# Patient Record
Sex: Male | Born: 1935 | Race: White | Hispanic: No | Marital: Married | Smoking: Former smoker
Health system: Southern US, Community
[De-identification: ages and names within clinical notes are randomized; demographics above are authoritative.]

## PROBLEM LIST (undated history)

## (undated) DIAGNOSIS — I4892 Unspecified atrial flutter: Secondary | ICD-10-CM

## (undated) DIAGNOSIS — I10 Essential (primary) hypertension: Secondary | ICD-10-CM

## (undated) DIAGNOSIS — I509 Heart failure, unspecified: Secondary | ICD-10-CM

## (undated) DIAGNOSIS — H900 Conductive hearing loss, bilateral: Secondary | ICD-10-CM

## (undated) DIAGNOSIS — F419 Anxiety disorder, unspecified: Secondary | ICD-10-CM

## (undated) DIAGNOSIS — R634 Abnormal weight loss: Secondary | ICD-10-CM

## (undated) DIAGNOSIS — R5383 Other fatigue: Secondary | ICD-10-CM

## (undated) DIAGNOSIS — M7500 Adhesive capsulitis of unspecified shoulder: Secondary | ICD-10-CM

## (undated) DIAGNOSIS — N401 Enlarged prostate with lower urinary tract symptoms: Secondary | ICD-10-CM

## (undated) DIAGNOSIS — K4030 Unilateral inguinal hernia, with obstruction, without gangrene, not specified as recurrent: Secondary | ICD-10-CM

## (undated) DIAGNOSIS — I420 Dilated cardiomyopathy: Secondary | ICD-10-CM

## (undated) HISTORY — DX: Dilated cardiomyopathy: I42.0

## (undated) HISTORY — DX: Essential (primary) hypertension: I10

## (undated) HISTORY — DX: Heart failure, unspecified: I50.9

## (undated) HISTORY — DX: Abnormal weight loss: R63.4

## (undated) HISTORY — DX: Unilateral inguinal hernia, with obstruction, without gangrene, not specified as recurrent: K40.30

## (undated) HISTORY — DX: Conductive hearing loss, bilateral: H90.0

## (undated) HISTORY — DX: Other fatigue: R53.83

## (undated) HISTORY — DX: Anxiety disorder, unspecified: F41.9

## (undated) HISTORY — DX: Adhesive capsulitis of unspecified shoulder: M75.00

## (undated) HISTORY — DX: Unspecified atrial flutter: I48.92

## (undated) HISTORY — DX: Benign prostatic hyperplasia with lower urinary tract symptoms: N40.1

---

## 2004-08-25 ENCOUNTER — Ambulatory Visit: Payer: Self-pay | Admitting: Internal Medicine

## 2005-01-20 ENCOUNTER — Ambulatory Visit: Payer: Self-pay | Admitting: Internal Medicine

## 2005-05-04 ENCOUNTER — Ambulatory Visit: Payer: Self-pay | Admitting: Internal Medicine

## 2005-09-15 ENCOUNTER — Ambulatory Visit: Payer: Self-pay | Admitting: Internal Medicine

## 2005-09-26 ENCOUNTER — Ambulatory Visit: Payer: Self-pay | Admitting: Internal Medicine

## 2005-12-15 ENCOUNTER — Ambulatory Visit: Payer: Self-pay | Admitting: Internal Medicine

## 2006-06-04 ENCOUNTER — Ambulatory Visit: Payer: Self-pay | Admitting: Internal Medicine

## 2006-06-04 LAB — CONVERTED CEMR LAB
AST: 23 units/L (ref 0–37)
Albumin: 4.3 g/dL (ref 3.5–5.2)
BUN: 19 mg/dL (ref 6–23)
Basophils Absolute: 0 10*3/uL (ref 0.0–0.1)
CO2: 28 meq/L (ref 19–32)
Calcium: 9.2 mg/dL (ref 8.4–10.5)
Chloride: 103 meq/L (ref 96–112)
Chol/HDL Ratio, serum: 3.8
Cholesterol: 181 mg/dL (ref 0–200)
Creatinine, Ser: 0.7 mg/dL (ref 0.4–1.5)
Eosinophil percent: 1.7 % (ref 0.0–5.0)
HCT: 47.8 % (ref 39.0–52.0)
HDL: 47.3 mg/dL (ref >39.0)
Hemoglobin: 16.1 g/dL (ref 13.0–17.0)
Lymphocytes Relative: 23.2 % (ref 12.0–46.0)
MCHC: 33.6 g/dL (ref 30.0–36.0)
Monocytes Relative: 8.6 % (ref 3.0–11.0)
Neutro Abs: 4.2 10*3/uL (ref 1.4–7.7)
Neutrophils Relative %: 66.3 % (ref 43.0–77.0)
PSA: 0.76 ng/mL (ref 0.10–4.00)
RDW: 13.8 % (ref 11.5–14.6)
TSH: 0.46 microintl units/mL (ref 0.35–5.50)
Total Bilirubin: 1.3 mg/dL — ABNORMAL HIGH (ref 0.3–1.2)
Triglyceride fasting, serum: 63 mg/dL (ref 0–149)

## 2006-08-28 ENCOUNTER — Ambulatory Visit: Payer: Self-pay | Admitting: Gastroenterology

## 2006-09-07 ENCOUNTER — Ambulatory Visit: Payer: Self-pay | Admitting: Gastroenterology

## 2006-09-07 HISTORY — PX: OTHER SURGICAL HISTORY: SHX169

## 2006-09-11 ENCOUNTER — Ambulatory Visit: Payer: Self-pay | Admitting: Internal Medicine

## 2007-01-17 ENCOUNTER — Ambulatory Visit: Payer: Self-pay | Admitting: Internal Medicine

## 2007-03-12 DIAGNOSIS — I10 Essential (primary) hypertension: Secondary | ICD-10-CM

## 2007-03-12 DIAGNOSIS — I110 Hypertensive heart disease with heart failure: Secondary | ICD-10-CM

## 2007-03-12 HISTORY — DX: Essential (primary) hypertension: I10

## 2007-05-03 ENCOUNTER — Ambulatory Visit: Payer: Self-pay | Admitting: Internal Medicine

## 2007-05-21 ENCOUNTER — Ambulatory Visit: Payer: Self-pay | Admitting: Internal Medicine

## 2007-07-15 ENCOUNTER — Telehealth: Payer: Self-pay | Admitting: Internal Medicine

## 2007-10-31 ENCOUNTER — Ambulatory Visit: Payer: Self-pay | Admitting: Internal Medicine

## 2007-10-31 DIAGNOSIS — K4030 Unilateral inguinal hernia, with obstruction, without gangrene, not specified as recurrent: Secondary | ICD-10-CM | POA: Insufficient documentation

## 2007-10-31 HISTORY — DX: Unilateral inguinal hernia, with obstruction, without gangrene, not specified as recurrent: K40.30

## 2007-12-26 ENCOUNTER — Ambulatory Visit: Payer: Self-pay | Admitting: Internal Medicine

## 2007-12-26 LAB — CONVERTED CEMR LAB
Albumin: 3.9 g/dL (ref 3.5–5.2)
Alkaline Phosphatase: 62 units/L (ref 39–117)
Basophils Absolute: 0 10*3/uL (ref 0.0–0.1)
Bilirubin, Direct: 0.1 mg/dL (ref 0.0–0.3)
Blood in Urine, dipstick: NEGATIVE
Eosinophils Absolute: 0.1 10*3/uL (ref 0.0–0.7)
GFR calc Af Amer: 107 mL/min
GFR calc non Af Amer: 88 mL/min
Glucose, Urine, Semiquant: NEGATIVE
HCT: 45.7 % (ref 39.0–52.0)
HDL: 40.2 mg/dL (ref >39.0)
Ketones, urine, test strip: NEGATIVE
MCV: 91.4 fL (ref 78.0–100.0)
Monocytes Absolute: 0.5 10*3/uL (ref 0.1–1.0)
Neutrophils Relative %: 64.2 % (ref 43.0–77.0)
Nitrite: NEGATIVE
Platelets: 227 10*3/uL (ref 150–400)
Potassium: 5 meq/L (ref 3.5–5.1)
Protein, U semiquant: NEGATIVE
RDW: 13.5 % (ref 11.5–14.6)
Sodium: 135 meq/L (ref 135–145)
Specific Gravity, Urine: 1.02
Total Bilirubin: 1 mg/dL (ref 0.3–1.2)
Triglycerides: 61 mg/dL (ref 0–149)
VLDL: 12 mg/dL (ref 0–40)
pH: 7

## 2008-01-02 ENCOUNTER — Ambulatory Visit: Payer: Self-pay | Admitting: Internal Medicine

## 2008-04-28 ENCOUNTER — Telehealth: Payer: Self-pay | Admitting: Internal Medicine

## 2008-05-19 ENCOUNTER — Ambulatory Visit: Payer: Self-pay | Admitting: Internal Medicine

## 2008-06-16 ENCOUNTER — Ambulatory Visit: Payer: Self-pay | Admitting: Internal Medicine

## 2008-06-16 DIAGNOSIS — H900 Conductive hearing loss, bilateral: Secondary | ICD-10-CM

## 2008-06-16 HISTORY — DX: Conductive hearing loss, bilateral: H90.0

## 2009-02-23 ENCOUNTER — Telehealth: Payer: Self-pay | Admitting: Internal Medicine

## 2009-04-21 ENCOUNTER — Ambulatory Visit: Payer: Self-pay | Admitting: Internal Medicine

## 2009-04-21 LAB — CONVERTED CEMR LAB
BUN: 18 mg/dL (ref 6–23)
Basophils Absolute: 0 10*3/uL (ref 0.0–0.1)
Bilirubin Urine: NEGATIVE
Bilirubin, Direct: 0 mg/dL (ref 0.0–0.3)
Blood in Urine, dipstick: NEGATIVE
CO2: 30 meq/L (ref 19–32)
Chloride: 105 meq/L (ref 96–112)
Cholesterol: 165 mg/dL (ref 0–200)
Creatinine, Ser: 0.7 mg/dL (ref 0.4–1.5)
Eosinophils Absolute: 0.1 10*3/uL (ref 0.0–0.7)
Ketones, urine, test strip: NEGATIVE
LDL Cholesterol: 107 mg/dL — ABNORMAL HIGH (ref 0–99)
MCHC: 34.4 g/dL (ref 30.0–36.0)
MCV: 88.7 fL (ref 78.0–100.0)
Monocytes Absolute: 0.5 10*3/uL (ref 0.1–1.0)
Neutrophils Relative %: 64.8 % (ref 43.0–77.0)
Platelets: 218 10*3/uL (ref 150.0–400.0)
Protein, U semiquant: NEGATIVE
Total Bilirubin: 1.3 mg/dL — ABNORMAL HIGH (ref 0.3–1.2)
Triglycerides: 59 mg/dL (ref 0.0–149.0)
Urobilinogen, UA: 0.2
WBC: 6.4 10*3/uL (ref 4.5–10.5)

## 2009-04-28 ENCOUNTER — Ambulatory Visit: Payer: Self-pay | Admitting: Internal Medicine

## 2009-04-28 DIAGNOSIS — N138 Other obstructive and reflux uropathy: Secondary | ICD-10-CM

## 2009-04-28 DIAGNOSIS — N401 Enlarged prostate with lower urinary tract symptoms: Secondary | ICD-10-CM

## 2009-04-28 HISTORY — DX: Benign prostatic hyperplasia with lower urinary tract symptoms: N13.8

## 2009-04-28 HISTORY — DX: Benign prostatic hyperplasia with lower urinary tract symptoms: N40.1

## 2009-12-20 ENCOUNTER — Ambulatory Visit: Payer: Self-pay | Admitting: Internal Medicine

## 2009-12-20 DIAGNOSIS — M7500 Adhesive capsulitis of unspecified shoulder: Secondary | ICD-10-CM | POA: Insufficient documentation

## 2009-12-20 HISTORY — DX: Adhesive capsulitis of unspecified shoulder: M75.00

## 2010-05-05 ENCOUNTER — Ambulatory Visit: Payer: Self-pay | Admitting: Internal Medicine

## 2010-09-04 LAB — CONVERTED CEMR LAB: PSA: 0.78 ng/mL (ref 0.10–4.00)

## 2010-09-06 NOTE — Assessment & Plan Note (Signed)
Summary: FLU SHOT/CB  Nurse Visit   Review of Systems       Flu Vaccine Consent Questions     Do you have a history of severe allergic reactions to this vaccine? no    Any prior history of allergic reactions to egg and/or gelatin? no    Do you have a sensitivity to the preservative Thimersol? no    Do you have a past history of Guillan-Barre Syndrome? no    Do you currently have an acute febrile illness? no    Have you ever had a severe reaction to latex? no    Vaccine information given and explained to patient? yes    Are you currently pregnant? no    Lot Number:[id number]   Exp Date:02/04/2011   Site Given  Left Deltoid IM    Allergies: No Known Drug Allergies  Orders Added: 1)  Flu Vaccine 76yrs + MEDICARE PATIENTS [Q2039] 2)  Administration Flu vaccine - MCR [G0008]
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
## 2010-12-19 ENCOUNTER — Telehealth: Payer: Self-pay | Admitting: *Deleted

## 2010-12-19 NOTE — Telephone Encounter (Signed)
Wife called requesting appt with Dr. Lovell Sheehan, but no openings per Dr. Lovell Sheehan.  Offered a different MD, and she will talk to him and call back. He had a virus, and now is having night sweats.

## 2010-12-20 NOTE — Telephone Encounter (Signed)
Did not call back.

## 2010-12-29 ENCOUNTER — Encounter: Payer: Self-pay | Admitting: Internal Medicine

## 2011-01-13 ENCOUNTER — Ambulatory Visit (INDEPENDENT_AMBULATORY_CARE_PROVIDER_SITE_OTHER): Payer: Medicare Other | Admitting: Internal Medicine

## 2011-01-13 ENCOUNTER — Other Ambulatory Visit (INDEPENDENT_AMBULATORY_CARE_PROVIDER_SITE_OTHER): Payer: Medicare Other

## 2011-01-13 ENCOUNTER — Other Ambulatory Visit: Payer: Self-pay | Admitting: Internal Medicine

## 2011-01-13 ENCOUNTER — Ambulatory Visit (INDEPENDENT_AMBULATORY_CARE_PROVIDER_SITE_OTHER)
Admission: RE | Admit: 2011-01-13 | Discharge: 2011-01-13 | Disposition: A | Discharge status: Home / Self Care | Payer: Medicare Other | Source: Ambulatory Visit | Attending: Internal Medicine | Admitting: Internal Medicine

## 2011-01-13 VITALS — BP 136/84 | HR 72 | Temp 98.2°F | Resp 14 | Ht 68.0 in | Wt 145.0 lb

## 2011-01-13 DIAGNOSIS — I10 Essential (primary) hypertension: Secondary | ICD-10-CM

## 2011-01-13 DIAGNOSIS — R634 Abnormal weight loss: Secondary | ICD-10-CM

## 2011-01-13 DIAGNOSIS — F329 Major depressive disorder, single episode, unspecified: Secondary | ICD-10-CM

## 2011-01-13 LAB — CBC WITH DIFFERENTIAL/PLATELET
Basophils Absolute: 0 10*3/uL (ref 0.0–0.1)
Basophils Relative: 0.2 % (ref 0.0–3.0)
Eosinophils Absolute: 0.2 10*3/uL (ref 0.0–0.7)
Eosinophils Relative: 2.7 % (ref 0.0–5.0)
HCT: 44 % (ref 39.0–52.0)
Hemoglobin: 15.1 g/dL (ref 13.0–17.0)
Lymphs Abs: 2.9 10*3/uL (ref 0.7–4.0)
Monocytes Relative: 6 % (ref 3.0–12.0)
Neutro Abs: 5.5 10*3/uL (ref 1.4–7.7)
Platelets: 224 10*3/uL (ref 150.0–400.0)
RBC: 4.85 Mil/uL (ref 4.22–5.81)
WBC: 9.3 10*3/uL (ref 4.5–10.5)

## 2011-01-13 LAB — TSH: TSH: 0.36 u[IU]/mL (ref 0.35–5.50)

## 2011-01-13 LAB — HEPATIC FUNCTION PANEL: Total Bilirubin: 0.8 mg/dL (ref 0.3–1.2)

## 2011-01-13 MED ORDER — MIRTAZAPINE 15 MG PO TABS: 15.0000 mg | ORAL_TABLET | Freq: Every day | ORAL | Status: DC

## 2011-01-13 NOTE — Progress Notes (Signed)
  Subjective:    Patient ID: Bryan Paul, male    DOB: 09-26-1934, 76 y.o.   MRN: [id number]  HPI And has lost 10 pounds since he was last seen he was last seen one year ago.  Family is concerned about his weight loss he has not had any night sweats no diarrhea no cough no blood in stool or dark stools he has been feeling mild to moderate depression he does sleep well. His risk factors do not include smoking his mother had uterine cancer. He does not have any cramps no reported pain    Review of Systems  Constitutional: Negative for fever and fatigue.  HENT: Negative for hearing loss, congestion, neck pain and postnasal drip.   Eyes: Negative for discharge, redness and visual disturbance.  Respiratory: Negative for cough, shortness of breath and wheezing.   Cardiovascular: Negative for leg swelling.  Gastrointestinal: Negative for abdominal pain, constipation and abdominal distention.  Genitourinary: Negative for urgency and frequency.  Musculoskeletal: Negative for joint swelling and arthralgias.  Skin: Negative for color change and rash.  Neurological: Negative for weakness and light-headedness.  Hematological: Negative for adenopathy.  Psychiatric/Behavioral: Negative for behavioral problems.   Past Medical History  Diagnosis Date  . Hypertension    Past Surgical History  Procedure Date  . Colonscopy 09/07/2006    reports that he has quit smoking. He does not have any smokeless tobacco history on file. He reports that he does not drink alcohol or use illicit drugs. family history includes Aneurysm in an unspecified family member. Allergies not on file     Objective:   Physical Exam  Constitutional: He appears well-developed and well-nourished.  HENT:  Head: Normocephalic and atraumatic.  Eyes: Conjunctivae are normal. Pupils are equal, round, and reactive to light.  Neck: Normal range of motion. Neck supple.  Cardiovascular: Normal rate and regular rhythm.     Pulmonary/Chest: Effort normal and breath sounds normal.  Abdominal: Soft. Bowel sounds are normal.          Assessment & Plan:  I do not see any ominous signs with a weight loss however I should monitor a thyroid a CBC with differential and a liver function today.  We will also obtain a screening chest x-ray. We will begin Remeron 15 mg by mouth each bedtime

## 2011-03-02 ENCOUNTER — Encounter: Payer: Self-pay | Admitting: Internal Medicine

## 2011-05-15 ENCOUNTER — Ambulatory Visit (INDEPENDENT_AMBULATORY_CARE_PROVIDER_SITE_OTHER): Payer: Medicare Other | Admitting: Internal Medicine

## 2011-05-15 ENCOUNTER — Ambulatory Visit: Payer: No Typology Code available for payment source | Admitting: Internal Medicine

## 2011-05-15 DIAGNOSIS — Z23 Encounter for immunization: Secondary | ICD-10-CM

## 2011-06-06 ENCOUNTER — Telehealth: Payer: Self-pay | Admitting: Internal Medicine

## 2011-06-06 NOTE — Telephone Encounter (Signed)
Pt requesting a prescription for his nerves pt requesting ativan  [facility] [phone]

## 2011-06-06 NOTE — Telephone Encounter (Signed)
Per dr Lovell Sheehan- m ay have an ov with anyone to discuss

## 2011-06-13 ENCOUNTER — Ambulatory Visit (INDEPENDENT_AMBULATORY_CARE_PROVIDER_SITE_OTHER): Payer: Medicare Other | Admitting: Internal Medicine

## 2011-06-13 ENCOUNTER — Encounter: Payer: Self-pay | Admitting: Internal Medicine

## 2011-06-13 ENCOUNTER — Ambulatory Visit: Payer: Medicare Other | Admitting: Family Medicine

## 2011-06-13 ENCOUNTER — Telehealth: Payer: Self-pay | Admitting: Internal Medicine

## 2011-06-13 DIAGNOSIS — H900 Conductive hearing loss, bilateral: Secondary | ICD-10-CM

## 2011-06-13 DIAGNOSIS — R634 Abnormal weight loss: Secondary | ICD-10-CM

## 2011-06-13 DIAGNOSIS — F419 Anxiety disorder, unspecified: Secondary | ICD-10-CM

## 2011-06-13 DIAGNOSIS — F329 Major depressive disorder, single episode, unspecified: Secondary | ICD-10-CM

## 2011-06-13 DIAGNOSIS — I10 Essential (primary) hypertension: Secondary | ICD-10-CM

## 2011-06-13 DIAGNOSIS — N401 Enlarged prostate with lower urinary tract symptoms: Secondary | ICD-10-CM

## 2011-06-13 HISTORY — DX: Abnormal weight loss: R63.4

## 2011-06-13 HISTORY — DX: Anxiety disorder, unspecified: F41.9

## 2011-06-13 LAB — CBC WITH DIFFERENTIAL/PLATELET
Basophils Relative: 0.4 % (ref 0.0–3.0)
Eosinophils Absolute: 0.2 10*3/uL (ref 0.0–0.7)
Eosinophils Relative: 2 % (ref 0.0–5.0)
Lymphocytes Relative: 25.6 % (ref 12.0–46.0)
Neutrophils Relative %: 63.6 % (ref 43.0–77.0)
RBC: 5.17 Mil/uL (ref 4.22–5.81)
WBC: 7.8 10*3/uL (ref 4.5–10.5)

## 2011-06-13 LAB — TSH: TSH: 0.57 u[IU]/mL (ref 0.35–5.50)

## 2011-06-13 MED ORDER — MIRTAZAPINE 15 MG PO TABS: 15.0000 mg | ORAL_TABLET | Freq: Every day | ORAL | Status: DC

## 2011-06-13 MED ORDER — SAW PALMETTO (SERENOA REPENS) 160 MG PO CAPS: 160.0000 mg | ORAL_CAPSULE | Freq: Two times a day (BID) | ORAL | Status: AC

## 2011-06-13 MED ORDER — LORAZEPAM 0.5 MG PO TABS: 0.5000 mg | ORAL_TABLET | Freq: Two times a day (BID) | ORAL | Status: DC

## 2011-06-13 MED ORDER — SOLIFENACIN SUCCINATE 5 MG PO TABS: 5.0000 mg | ORAL_TABLET | Freq: Every day | ORAL | Status: DC

## 2011-06-13 NOTE — Telephone Encounter (Signed)
Left message on machine Needs to pick up at license Tag department e

## 2011-06-13 NOTE — Progress Notes (Signed)
  Subjective:    Patient ID: Bryan Paul, male    DOB: 12/10/1934, 76 y.o.   MRN: [id number]  HPI This 76 year old with a history of hypertension and hearing loss who presents with 2 complaints number one his wife perceives that he has been losing weight we reviewed his weight over the past 2 years and he has indeed lost 10 pounds but his weight appears to fluctuate and currently he is up 3 pounds from the lowest point.  He states that he well he does not have diarrhea he denied any dizziness or syncope.  He does have anxiety and has not been sleeping well but care for a son with HIV. He has not been taking his antidepressant and he has been given a trial of saw palmetto for benign prostatic hypertrophy that has not been effective.  His wife states that he urinates "every hour on the hour"   Review of Systems  Constitutional: Negative for fever and fatigue.  HENT: Negative for hearing loss, congestion, neck pain and postnasal drip.   Eyes: Negative for discharge, redness and visual disturbance.  Respiratory: Negative for cough, shortness of breath and wheezing.   Cardiovascular: Negative for leg swelling.  Gastrointestinal: Negative for abdominal pain, constipation and abdominal distention.  Genitourinary: Negative for urgency and frequency.  Musculoskeletal: Negative for joint swelling and arthralgias.  Skin: Negative for color change and rash.  Neurological: Negative for weakness and light-headedness.  Hematological: Negative for adenopathy.  Psychiatric/Behavioral: Negative for behavioral problems.   Past Medical History  Diagnosis Date  . Hypertension    Past Surgical History  Procedure Date  . Colonscopy 09/07/2006    reports that he has quit smoking. He does not have any smokeless tobacco history on file. He reports that he does not drink alcohol or use illicit drugs. family history includes Aneurysm in an unspecified family member. No Known Allergies     Objective:    Physical Exam  Nursing note and vitals reviewed. Constitutional: He appears well-developed and well-nourished.  HENT:  Head: Normocephalic and atraumatic.  Eyes: Conjunctivae are normal. Pupils are equal, round, and reactive to light.  Neck: Normal range of motion. Neck supple.  Cardiovascular: Normal rate and regular rhythm.   Pulmonary/Chest: Effort normal and breath sounds normal.  Abdominal: Soft. Bowel sounds are normal.          Assessment & Plan:  For the benign prostatic hypertrophy we will do a trial of VESIcare 5 mg by mouth with supper and monitor his progress we'll discontinue the saw palmetto since it has been unaffected.  His blood pressure stable on his current medications we'll make no changes  We discussed the weight loss and I do not see a pathologic cause for the weight loss however I will monitor TSH and CBC as well as a PSA. We will give him Ativan at bedtime for anxiety and sleep.

## 2011-06-13 NOTE — Patient Instructions (Signed)
The patient is instructed to continue all medications as prescribed. Schedule followup with check out clerk upon leaving the clinic  

## 2011-06-13 NOTE — Telephone Encounter (Signed)
Pts wife called and is req to get 2 handicap placards for their two vehicles. Pls call when ready for pick up.

## 2011-07-11 ENCOUNTER — Telehealth: Payer: Self-pay

## 2011-07-11 NOTE — Telephone Encounter (Signed)
Pt's son called and pt was put on anti-depressent and it has made it him really sick.  Pt has stopped the medication.  Pt needs his lorazepam called in to [facility].  Pt's son Allyn Bertoni believes pt lost the rx for his medication. Pls advise.

## 2011-07-11 NOTE — Telephone Encounter (Signed)
Talked with pharmacy and they state they have not filled it there in about a year- his wife called to see when it was refilled and steve called to see when his was last filled- please advise

## 2011-07-12 ENCOUNTER — Other Ambulatory Visit: Payer: Self-pay | Admitting: *Deleted

## 2011-07-12 NOTE — Telephone Encounter (Signed)
Called in- ok per dr Lovell Sheehan after talking directly with pt

## 2011-08-16 ENCOUNTER — Ambulatory Visit: Payer: Medicare Other | Admitting: Internal Medicine

## 2011-09-28 ENCOUNTER — Telehealth: Payer: Self-pay | Admitting: Internal Medicine

## 2011-09-28 NOTE — Telephone Encounter (Signed)
Pt need samples of vesicare 5mg . Pt has 4 pills left

## 2011-09-29 NOTE — Telephone Encounter (Signed)
pt'ws wife informed- they are out front

## 2011-12-13 ENCOUNTER — Telehealth: Payer: Self-pay | Admitting: Internal Medicine

## 2011-12-13 NOTE — Telephone Encounter (Signed)
Left message on machine Samples up fron t for pt

## 2011-12-13 NOTE — Telephone Encounter (Signed)
Pt requesting samples of vesicare. Pt would like to pick up today. Please contact

## 2011-12-27 ENCOUNTER — Other Ambulatory Visit: Payer: Self-pay | Admitting: *Deleted

## 2011-12-27 DIAGNOSIS — F419 Anxiety disorder, unspecified: Secondary | ICD-10-CM

## 2011-12-27 MED ORDER — LORAZEPAM 0.5 MG PO TABS: 0.5000 mg | ORAL_TABLET | Freq: Two times a day (BID) | ORAL | Status: DC

## 2012-01-19 ENCOUNTER — Ambulatory Visit: Payer: Medicare Other | Admitting: Internal Medicine

## 2012-02-05 ENCOUNTER — Other Ambulatory Visit: Payer: Self-pay | Admitting: *Deleted

## 2012-02-05 DIAGNOSIS — F419 Anxiety disorder, unspecified: Secondary | ICD-10-CM

## 2012-02-05 MED ORDER — LORAZEPAM 0.5 MG PO TABS: 0.5000 mg | ORAL_TABLET | Freq: Two times a day (BID) | ORAL | Status: AC

## 2012-04-23 ENCOUNTER — Telehealth: Payer: Self-pay | Admitting: Internal Medicine

## 2012-04-23 NOTE — Telephone Encounter (Signed)
Pt needs samples of micardis 80 mg. If no samples of avail call rx into [facility] [phone]

## 2012-04-23 NOTE — Telephone Encounter (Signed)
4 boxes out front for pt-family informed

## 2012-05-06 ENCOUNTER — Encounter: Payer: Self-pay | Admitting: Internal Medicine

## 2012-05-06 ENCOUNTER — Ambulatory Visit (INDEPENDENT_AMBULATORY_CARE_PROVIDER_SITE_OTHER): Payer: Medicare Other | Admitting: Internal Medicine

## 2012-05-06 VITALS — BP 160/90 | HR 76 | Temp 98.2°F | Resp 16 | Ht 68.0 in | Wt 152.0 lb

## 2012-05-06 DIAGNOSIS — N401 Enlarged prostate with lower urinary tract symptoms: Secondary | ICD-10-CM

## 2012-05-06 DIAGNOSIS — Z23 Encounter for immunization: Secondary | ICD-10-CM

## 2012-05-06 DIAGNOSIS — N411 Chronic prostatitis: Secondary | ICD-10-CM

## 2012-05-06 DIAGNOSIS — I10 Essential (primary) hypertension: Secondary | ICD-10-CM

## 2012-05-06 DIAGNOSIS — R350 Frequency of micturition: Secondary | ICD-10-CM

## 2012-05-06 LAB — POCT URINALYSIS DIPSTICK
Leukocytes, UA: NEGATIVE
Nitrite, UA: NEGATIVE
Protein, UA: NEGATIVE
Spec Grav, UA: 1.025
Urobilinogen, UA: 0.2

## 2012-05-06 MED ORDER — SOLIFENACIN SUCCINATE 10 MG PO TABS: 10.0000 mg | ORAL_TABLET | Freq: Every day | ORAL | Status: DC

## 2012-05-06 MED ORDER — CIPROFLOXACIN HCL 500 MG PO TABS: 500.0000 mg | ORAL_TABLET | Freq: Two times a day (BID) | ORAL | Status: DC

## 2012-05-06 NOTE — Progress Notes (Signed)
  Subjective:    Patient ID: Bryan Paul, male    DOB: 01/26/1935, 77 y.o.   MRN: [id number]  HPI The patient has been incontinent at  Night. Hard to start flow. Wakes up 2-3 times at night to uninate Notes leaking on the way to the rest room Added vesicar and it helped  Out of micardis for several days    Review of Systems  Genitourinary: Positive for urgency, frequency and decreased urine volume.  Musculoskeletal: Positive for back pain.       Objective:   Physical Exam  Nursing note and vitals reviewed. Cardiovascular: Normal rate and regular rhythm.   Murmur heard. Pulmonary/Chest: Effort normal and breath sounds normal.  Abdominal: Soft. Bowel sounds are normal.  Genitourinary:       Prostate +2 enlarged tender to touch can appreciate normal architecture but is markedly enlarged to approximately 60 g          Assessment & Plan:  Acute prostatitis on top of chronic BPH.  Will treat with Cipro 500 twice a day for 15 days.  Agree with continuing saw palmetto and VESIcare 10 mg by mouth daily stable blood pressure 1 Micardis note elevation today because patient was out of medication

## 2012-05-06 NOTE — Patient Instructions (Signed)
The patient is instructed to continue all medications as prescribed. Schedule followup with check out clerk upon leaving the clinic  

## 2012-05-07 LAB — CBC WITH DIFFERENTIAL/PLATELET
Eosinophils Relative: 2 % (ref 0.0–5.0)
Lymphocytes Relative: 24.9 % (ref 12.0–46.0)
MCV: 92.1 fl (ref 78.0–100.0)
Monocytes Absolute: 0.6 10*3/uL (ref 0.1–1.0)
Neutrophils Relative %: 65.4 % (ref 43.0–77.0)
Platelets: 228 10*3/uL (ref 150.0–400.0)
WBC: 8.1 10*3/uL (ref 4.5–10.5)

## 2012-05-07 LAB — BASIC METABOLIC PANEL
Chloride: 102 mEq/L (ref 96–112)
GFR: 90.82 mL/min (ref >60.00)
Glucose, Bld: 93 mg/dL (ref 70–99)
Potassium: 4.6 mEq/L (ref 3.5–5.1)
Sodium: 134 mEq/L — ABNORMAL LOW (ref 135–145)

## 2012-05-07 LAB — PSA: PSA: 1.02 ng/mL (ref 0.10–4.00)

## 2012-06-03 ENCOUNTER — Telehealth: Payer: Self-pay | Admitting: Internal Medicine

## 2012-06-13 NOTE — Telephone Encounter (Signed)
Opened in error

## 2012-07-02 ENCOUNTER — Ambulatory Visit: Payer: Medicare Other | Admitting: Internal Medicine

## 2012-08-13 ENCOUNTER — Ambulatory Visit: Payer: Medicare Other | Admitting: Internal Medicine

## 2012-08-23 ENCOUNTER — Ambulatory Visit (INDEPENDENT_AMBULATORY_CARE_PROVIDER_SITE_OTHER): Payer: Medicare Other | Admitting: Internal Medicine

## 2012-08-23 ENCOUNTER — Encounter: Payer: Self-pay | Admitting: Internal Medicine

## 2012-08-23 VITALS — BP 140/80 | HR 76 | Temp 98.2°F | Resp 16 | Ht 68.0 in | Wt 154.0 lb

## 2012-08-23 DIAGNOSIS — I10 Essential (primary) hypertension: Secondary | ICD-10-CM

## 2012-08-23 DIAGNOSIS — N400 Enlarged prostate without lower urinary tract symptoms: Secondary | ICD-10-CM

## 2012-08-23 MED ORDER — DOXAZOSIN MESYLATE 4 MG PO TABS: 4.0000 mg | ORAL_TABLET | Freq: Every day | ORAL | Status: DC

## 2012-08-23 NOTE — Patient Instructions (Addendum)
The cardura will work both for the urine and the blood pressure

## 2012-08-23 NOTE — Progress Notes (Signed)
  Subjective:    Patient ID: Bryan Paul, male    DOB: Jul 02, 1935, 78 y.o.   MRN: [id number]  HPI Patient is a 78 year old male who is followed for hypertension and BPH was symptomology.  This had some urine incontinence and VESIcare did help.the cost of VESIcare in the cost of his blood pressure medications has created an issue of noncompliance   Review of Systems  Constitutional: Negative for fever and fatigue.  HENT: Positive for hearing loss. Negative for congestion, neck pain and postnasal drip.   Eyes: Negative for discharge, redness and visual disturbance.  Respiratory: Negative for cough, shortness of breath and wheezing.   Cardiovascular: Negative for leg swelling.  Gastrointestinal: Negative for abdominal pain, constipation and abdominal distention.  Genitourinary: Positive for urgency, frequency, enuresis and difficulty urinating. Negative for dysuria.  Musculoskeletal: Negative for joint swelling and arthralgias.  Skin: Negative for color change and rash.  Neurological: Negative for weakness and light-headedness.  Hematological: Negative for adenopathy.  Psychiatric/Behavioral: Negative for behavioral problems.       Objective:   Physical Exam  Constitutional: He appears well-developed and well-nourished.  HENT:  Head: Normocephalic and atraumatic.  Eyes: Conjunctivae normal are normal. Pupils are equal, round, and reactive to light.  Neck: Normal range of motion. Neck supple.  Cardiovascular: Normal rate and regular rhythm.   Pulmonary/Chest: Effort normal and breath sounds normal.  Abdominal: Soft. Bowel sounds are normal.          Assessment & Plan:  Cardura 4 mg one by mouth daily at bedtime for blood pressure and prostate symptoms

## 2012-11-25 ENCOUNTER — Ambulatory Visit: Payer: Medicare Other | Admitting: Internal Medicine

## 2013-03-19 ENCOUNTER — Ambulatory Visit: Payer: Medicare Other | Admitting: Internal Medicine

## 2013-04-03 ENCOUNTER — Telehealth: Payer: Self-pay | Admitting: Internal Medicine

## 2013-04-03 MED ORDER — LORAZEPAM 0.5 MG PO TABS: 0.5000 mg | ORAL_TABLET | Freq: Two times a day (BID) | ORAL | Status: DC | PRN

## 2013-04-03 NOTE — Telephone Encounter (Signed)
PT wife is calling to request refill of LORazepam (ATIVAN) 0.5 MG tablet. She would like this sent to [facility] in [place]. Please assist.

## 2013-04-03 NOTE — Telephone Encounter (Signed)
done

## 2013-04-14 ENCOUNTER — Ambulatory Visit (INDEPENDENT_AMBULATORY_CARE_PROVIDER_SITE_OTHER): Payer: Medicare Other | Admitting: Internal Medicine

## 2013-04-14 ENCOUNTER — Encounter: Payer: Self-pay | Admitting: Internal Medicine

## 2013-04-14 VITALS — BP 140/84 | HR 72 | Temp 98.2°F | Resp 16 | Ht 68.0 in | Wt 149.0 lb

## 2013-04-14 DIAGNOSIS — Z23 Encounter for immunization: Secondary | ICD-10-CM

## 2013-04-14 DIAGNOSIS — N138 Other obstructive and reflux uropathy: Secondary | ICD-10-CM

## 2013-04-14 DIAGNOSIS — N139 Obstructive and reflux uropathy, unspecified: Secondary | ICD-10-CM

## 2013-04-14 DIAGNOSIS — R634 Abnormal weight loss: Secondary | ICD-10-CM

## 2013-04-14 DIAGNOSIS — E039 Hypothyroidism, unspecified: Secondary | ICD-10-CM

## 2013-04-14 DIAGNOSIS — N401 Enlarged prostate with lower urinary tract symptoms: Secondary | ICD-10-CM

## 2013-04-14 NOTE — Progress Notes (Signed)
  Subjective:    Patient ID: Bryan Paul, male    DOB: April 18, 1935, 78 y.o.   MRN: [id number]  HPI Has not gained weight  BMI  Is 22 No diarrhea nausea or chest pain Blood pressure good     Review of Systems  Constitutional: Negative for fever and fatigue.  HENT: Negative for hearing loss, congestion, neck pain and postnasal drip.   Eyes: Negative for discharge, redness and visual disturbance.  Respiratory: Negative for cough, shortness of breath and wheezing.   Cardiovascular: Negative for leg swelling.  Gastrointestinal: Negative for abdominal pain, constipation and abdominal distention.  Genitourinary: Negative for urgency and frequency.  Musculoskeletal: Negative for joint swelling and arthralgias.  Skin: Negative for color change and rash.  Neurological: Negative for weakness and light-headedness.  Hematological: Negative for adenopathy.  Psychiatric/Behavioral: Negative for behavioral problems.   Past Medical History  Diagnosis Date  . Hypertension     History   Social History  . Marital Status: Married    Spouse Name: N/A    Number of Children: N/A  . Years of Education: N/A   Occupational History  . Not on file.   Social History Main Topics  . Smoking status: Former Games developer  . Smokeless tobacco: Not on file  . Alcohol Use: No  . Drug Use: No  . Sexual Activity: Not on file   Other Topics Concern  . Not on file   Social History Narrative  . No narrative on file    Past Surgical History  Procedure Laterality Date  . Colonscopy  09/07/2006    Family History  Problem Relation Age of Onset  . Aneurysm      No Known Allergies  Current Outpatient Prescriptions on File Prior to Visit  Medication Sig Dispense Refill  . aspirin 81 MG tablet Take 81 mg by mouth daily.        Marland Kitchen doxazosin (CARDURA) 4 MG tablet Take 1 tablet (4 mg total) by mouth at bedtime.  30 tablet  11  . LORazepam (ATIVAN) 0.5 MG tablet Take 1 tablet (0.5 mg total) by mouth 2  (two) times daily as needed for anxiety.  30 tablet  1   No current facility-administered medications on file prior to visit.    BP 140/84  Pulse 72  Temp(Src) 98.2 F (36.8 C)  Resp 16  Ht 5\' 8"  (1.727 m)  Wt 149 lb (67.586 kg)  BMI 22.66 kg/m2       Objective:   Physical Exam  Constitutional: He appears well-developed and well-nourished.  HENT:  Head: Normocephalic and atraumatic.  Eyes: Conjunctivae are normal. Pupils are equal, round, and reactive to light.  Neck: Normal range of motion. Neck supple.  Cardiovascular: Normal rate, regular rhythm and intact distal pulses.  PMI is not displaced.   Murmur heard.  Systolic murmur is present with a grade of 1/6  Pulmonary/Chest: Effort normal and breath sounds normal.  Abdominal: Soft. Bowel sounds are normal.          Assessment & Plan:  weigth loss monitor weight loss and fatigue with appropriate labs  Stable HTN BPH with outlet obstruction on carduar improved

## 2013-04-14 NOTE — Patient Instructions (Signed)
The patient is instructed to continue all medications as prescribed. Schedule followup with check out clerk upon leaving the clinic  

## 2013-04-15 LAB — COMPREHENSIVE METABOLIC PANEL
ALT: 17 U/L (ref 0–53)
CO2: 29 mEq/L (ref 19–32)
Calcium: 9.3 mg/dL (ref 8.4–10.5)
Chloride: 104 mEq/L (ref 96–112)
Creatinine, Ser: 0.8 mg/dL (ref 0.4–1.5)
GFR: 102.76 mL/min (ref >60.00)
Total Protein: 6.2 g/dL (ref 6.0–8.3)

## 2013-04-15 LAB — TSH: TSH: 0.41 u[IU]/mL (ref 0.35–5.50)

## 2013-05-13 ENCOUNTER — Telehealth: Payer: Self-pay | Admitting: Internal Medicine

## 2013-05-13 MED ORDER — LORAZEPAM 0.5 MG PO TABS
0.5000 mg | ORAL_TABLET | Freq: Two times a day (BID) | ORAL | Status: DC | PRN
Start: 2013-05-13 — End: 2013-08-13

## 2013-05-13 NOTE — Telephone Encounter (Signed)
Pt wife is calling to discuss labs from 04/14/13. She is also requesting a refill of his LORazepam (ATIVAN) 0.5 MG tablet, be sent to [facility] in [place]. Please assist.

## 2013-08-13 ENCOUNTER — Other Ambulatory Visit: Payer: Self-pay | Admitting: *Deleted

## 2013-08-13 ENCOUNTER — Telehealth: Payer: Self-pay | Admitting: Internal Medicine

## 2013-08-13 MED ORDER — LORAZEPAM 0.5 MG PO TABS: 0.5000 mg | ORAL_TABLET | Freq: Two times a day (BID) | ORAL | Status: DC | PRN

## 2013-08-13 NOTE — Telephone Encounter (Signed)
[facility] [place] requesting refill of LORazepam (ATIVAN) 0.5 MG tablet #30, 3 refills, last filled 07/19/13

## 2013-09-25 ENCOUNTER — Other Ambulatory Visit: Payer: Self-pay | Admitting: Internal Medicine

## 2013-09-29 ENCOUNTER — Ambulatory Visit (INDEPENDENT_AMBULATORY_CARE_PROVIDER_SITE_OTHER): Payer: Medicare Other | Admitting: Family

## 2013-09-29 ENCOUNTER — Encounter: Payer: Self-pay | Admitting: Family

## 2013-09-29 ENCOUNTER — Ambulatory Visit: Payer: Medicare Other | Admitting: Family

## 2013-09-29 VITALS — BP 148/88 | HR 74 | Ht 68.0 in | Wt 152.0 lb

## 2013-09-29 DIAGNOSIS — H902 Conductive hearing loss, unspecified: Secondary | ICD-10-CM

## 2013-09-29 DIAGNOSIS — I10 Essential (primary) hypertension: Secondary | ICD-10-CM

## 2013-09-29 DIAGNOSIS — I160 Hypertensive urgency: Secondary | ICD-10-CM

## 2013-09-29 MED ORDER — LISINOPRIL-HYDROCHLOROTHIAZIDE 10-12.5 MG PO TABS: 1.0000 | ORAL_TABLET | Freq: Every day | ORAL | Status: DC

## 2013-09-29 NOTE — Progress Notes (Signed)
Pre visit review using our clinic review tool, if applicable. No additional management support is needed unless otherwise documented below in the visit note. 

## 2013-09-29 NOTE — Progress Notes (Signed)
   Subjective:    Patient ID: Bryan Paul, male    DOB: 11-28-1934, 79 y.o.   MRN: [id number]  HPI  79 year old white male, nonsmoker, patient of Bryan Paul is in today as a hospital followup from 09/25/2013. Patient presented to [facility] with hypertensive urgency, blood pressure 220/107 initially taken at the pharmacy didn't confirm that the emergency department. Has a history of hypertension has been controlled by diet. Patient had a CT scan of the head due to memory disturbance that has completely resolved. Denies any chest pain, palpitations, shortness of breath or edema. He is tolerating lisinopril HCT 10/12.5 well.   Review of Systems  Constitutional: Negative.   Respiratory: Negative.   Cardiovascular: Negative.   Gastrointestinal: Negative.   Endocrine: Negative.   Musculoskeletal: Negative.   Skin: Positive for color change.  Neurological: Negative.   Hematological: Negative.   Psychiatric/Behavioral: Negative.    Past Medical History  Diagnosis Date  . Hypertension     History   Social History  . Marital Status: Married    Spouse Name: N/A    Number of Children: N/A  . Years of Education: N/A   Occupational History  . Not on file.   Social History Main Topics  . Smoking status: Former Games developer  . Smokeless tobacco: Not on file  . Alcohol Use: No  . Drug Use: No  . Sexual Activity: Not on file   Other Topics Concern  . Not on file   Social History Narrative  . No narrative on file    Past Surgical History  Procedure Laterality Date  . Colonscopy  09/07/2006    Family History  Problem Relation Age of Onset  . Aneurysm      No Known Allergies  Current Outpatient Prescriptions on File Prior to Visit  Medication Sig Dispense Refill  . aspirin 81 MG tablet Take 81 mg by mouth daily.        Marland Kitchen doxazosin (CARDURA) 4 MG tablet TAKE ONE TABLET BY MOUTH AT BEDTIME  30 tablet  0  . LORazepam (ATIVAN) 0.5 MG tablet Take 1 tablet (0.5 mg  total) by mouth 2 (two) times daily as needed for anxiety.  30 tablet  3   No current facility-administered medications on file prior to visit.    BP 148/88  Pulse 74  Ht 5\' 8"  (1.727 m)  Wt 152 lb (68.947 kg)  BMI 23.12 kg/m2chart    Objective:   Physical Exam  Constitutional: He is oriented to person, place, and time. He appears well-developed and well-nourished.  HENT:  Right Ear: External ear normal.  Left Ear: External ear normal.  Nose: Nose normal.  Mouth/Throat: Oropharynx is clear and moist.  Neck: Normal range of motion. Neck supple.  Cardiovascular: Normal rate, regular rhythm and normal heart sounds.   Pulmonary/Chest: Effort normal and breath sounds normal.  Musculoskeletal: Normal range of motion. He exhibits no edema.  Neurological: He is alert and oriented to person, place, and time.  Skin: Skin is warm and dry.  Psychiatric: He has a normal mood and affect.          Assessment & Plan:  Bryan Paul was seen today for follow-up.  Diagnoses and associated orders for this visit:  Hypertensive urgency  Conductive hearing loss  Other Orders - lisinopril-hydrochlorothiazide (PRINZIDE,ZESTORETIC) 10-12.5 MG per tablet; Take 1 tablet by mouth daily.   Recheck in 6 weeks to be sure electrolytes are stable and tolerating BP medication.

## 2013-09-29 NOTE — Patient Instructions (Signed)
Sodium-Controlled Diet Sodium is a mineral. It is found in many foods. Sodium may be found naturally or added during the making of a food. The most common form of sodium is salt, which is made up of sodium and chloride. Reducing your sodium intake involves changing your eating habits. The following guidelines will help you reduce the sodium in your diet:  Stop using the salt shaker.  Use salt sparingly in cooking and baking.  Substitute with sodium-free seasonings and spices.  Do not use a salt substitute (potassium chloride) without your caregiver's permission.  Include a variety of fresh, unprocessed foods in your diet.  Limit the use of processed and convenience foods that are high in sodium. USE THE FOLLOWING FOODS SPARINGLY: Breads/Starches  Commercial bread stuffing, commercial pancake or waffle mixes, coating mixes. Waffles. Croutons. Prepared (boxed or frozen) potato, rice, or noodle mixes that contain salt or sodium. Salted French fries or hash browns. Salted popcorn, breads, crackers, chips, or snack foods. Vegetables  Vegetables canned with salt or prepared in cream, butter, or cheese sauces. Sauerkraut. Tomato or vegetable juices canned with salt.  Fresh vegetables are allowed if rinsed thoroughly. Fruit  Fruit is okay to eat. Meat and Meat Substitutes  Salted or smoked meats, such as bacon or Canadian bacon, chipped or corned beef, hot dogs, salt pork, luncheon meats, pastrami, ham, or sausage. Canned or smoked fish, poultry, or meat. Processed cheese or cheese spreads, blue or Roquefort cheese. Battered or frozen fish products. Prepared spaghetti sauce. Baked beans. Reuben sandwiches. Salted nuts. Caviar. Milk  Limit buttermilk to 1 cup per week. Soups and Combination Foods  Bouillon cubes, canned or dried soups, broth, consomm. Convenience (frozen or packaged) dinners with more than 600 mg sodium. Pot pies, pizza, Asian food, fast food cheeseburgers, and specialty  sandwiches. Desserts and Sweets  Regular (salted) desserts, pie, commercial fruit snack pies, commercial snack cakes, canned puddings.  Eat desserts and sweets in moderation. Fats and Oils  Gravy mixes or canned gravy. No more than 1 to 2 tbs of salad dressing. Chip dips.  Eat fats and oils in moderation. Beverages  See those listed under the vegetables and milk groups. Condiments  Ketchup, mustard, meat sauces, salsa, regular (salted) and lite soy sauce or mustard. Dill pickles, olives, meat tenderizer. Prepared horseradish or pickle relish. Dutch-processed cocoa. Baking powder or baking soda used medicinally. Worcestershire sauce. "Light" salt. Salt substitute, unless approved by your caregiver. Document Released: 01/13/2002 Document Revised: 10/16/2011 Document Reviewed: 08/16/2009 ExitCare Patient Information 2014 [employer].  

## 2013-10-23 ENCOUNTER — Other Ambulatory Visit: Payer: Self-pay | Admitting: Internal Medicine

## 2013-11-10 ENCOUNTER — Ambulatory Visit (INDEPENDENT_AMBULATORY_CARE_PROVIDER_SITE_OTHER): Payer: Medicare Other | Admitting: Family

## 2013-11-10 ENCOUNTER — Encounter: Payer: Self-pay | Admitting: Family

## 2013-11-10 VITALS — BP 138/80 | HR 81 | Ht 68.0 in | Wt 146.0 lb

## 2013-11-10 DIAGNOSIS — R42 Dizziness and giddiness: Secondary | ICD-10-CM

## 2013-11-10 DIAGNOSIS — I10 Essential (primary) hypertension: Secondary | ICD-10-CM

## 2013-11-10 LAB — POCT URINALYSIS DIPSTICK
Bilirubin, UA: NEGATIVE
Blood, UA: NEGATIVE
GLUCOSE UA: NEGATIVE
KETONES UA: NEGATIVE
LEUKOCYTES UA: NEGATIVE
Nitrite, UA: NEGATIVE
PROTEIN UA: NEGATIVE
SPEC GRAV UA: 1.01
Urobilinogen, UA: 0.2
pH, UA: 6.5

## 2013-11-10 LAB — CBC WITH DIFFERENTIAL/PLATELET
BASOS PCT: 0.5 % (ref 0.0–3.0)
Basophils Absolute: 0 10*3/uL (ref 0.0–0.1)
EOS PCT: 2.6 % (ref 0.0–5.0)
Eosinophils Absolute: 0.2 10*3/uL (ref 0.0–0.7)
HCT: 43 % (ref 39.0–52.0)
Hemoglobin: 14.5 g/dL (ref 13.0–17.0)
LYMPHS PCT: 23.6 % (ref 12.0–46.0)
Lymphs Abs: 1.5 10*3/uL (ref 0.7–4.0)
MCHC: 33.8 g/dL (ref 30.0–36.0)
MCV: 91.2 fl (ref 78.0–100.0)
MONO ABS: 0.6 10*3/uL (ref 0.1–1.0)
MONOS PCT: 8.5 % (ref 3.0–12.0)
NEUTROS PCT: 64.8 % (ref 43.0–77.0)
Neutro Abs: 4.3 10*3/uL (ref 1.4–7.7)
PLATELETS: 223 10*3/uL (ref 150.0–400.0)
RBC: 4.72 Mil/uL (ref 4.22–5.81)
RDW: 14.3 % (ref 11.5–14.6)
WBC: 6.6 10*3/uL (ref 4.5–10.5)

## 2013-11-10 LAB — COMPREHENSIVE METABOLIC PANEL
ALBUMIN: 4.2 g/dL (ref 3.5–5.2)
ALK PHOS: 71 U/L (ref 39–117)
ALT: 16 U/L (ref 0–53)
AST: 16 U/L (ref 0–37)
BUN: 23 mg/dL (ref 6–23)
CALCIUM: 9.1 mg/dL (ref 8.4–10.5)
CHLORIDE: 102 meq/L (ref 96–112)
CO2: 27 meq/L (ref 19–32)
Creatinine, Ser: 0.8 mg/dL (ref 0.4–1.5)
GFR: 94.19 mL/min (ref >60.00)
GLUCOSE: 93 mg/dL (ref 70–99)
POTASSIUM: 4.3 meq/L (ref 3.5–5.1)
SODIUM: 136 meq/L (ref 135–145)
TOTAL PROTEIN: 6.9 g/dL (ref 6.0–8.3)
Total Bilirubin: 0.8 mg/dL (ref 0.3–1.2)

## 2013-11-10 NOTE — Progress Notes (Signed)
Pre visit review using our clinic review tool, if applicable. No additional management support is needed unless otherwise documented below in the visit note. 

## 2013-11-10 NOTE — Patient Instructions (Signed)
Dehydration, Adult Dehydration is when you lose more fluids from the body than you take in. Vital organs like the kidneys, brain, and heart cannot function without a proper amount of fluids and salt. Any loss of fluids from the body can cause dehydration.  CAUSES   Vomiting.  Diarrhea.  Excessive sweating.  Excessive urine output.  Fever. SYMPTOMS  Mild dehydration  Thirst.  Dry lips.  Slightly dry mouth. Moderate dehydration  Very dry mouth.  Sunken eyes.  Skin does not bounce back quickly when lightly pinched and released.  Dark urine and decreased urine production.  Decreased tear production.  Headache. Severe dehydration  Very dry mouth.  Extreme thirst.  Rapid, weak pulse (more than 100 beats per minute at rest).  Cold hands and feet.  Not able to sweat in spite of heat and temperature.  Rapid breathing.  Blue lips.  Confusion and lethargy.  Difficulty being awakened.  Minimal urine production.  No tears. DIAGNOSIS  Your caregiver will diagnose dehydration based on your symptoms and your exam. Blood and urine tests will help confirm the diagnosis. The diagnostic evaluation should also identify the cause of dehydration. TREATMENT  Treatment of mild or moderate dehydration can often be done at home by increasing the amount of fluids that you drink. It is best to drink small amounts of fluid more often. Drinking too much at one time can make vomiting worse. Refer to the home care instructions below. Severe dehydration needs to be treated at the hospital where you will probably be given intravenous (IV) fluids that contain water and electrolytes. HOME CARE INSTRUCTIONS   Ask your caregiver about specific rehydration instructions.  Drink enough fluids to keep your urine clear or pale yellow.  Drink small amounts frequently if you have nausea and vomiting.  Eat as you normally do.  Avoid:  Foods or drinks high in sugar.  Carbonated  drinks.  Juice.  Extremely hot or cold fluids.  Drinks with caffeine.  Fatty, greasy foods.  Alcohol.  Tobacco.  Overeating.  Gelatin desserts.  Wash your hands well to avoid spreading bacteria and viruses.  Only take over-the-counter or prescription medicines for pain, discomfort, or fever as directed by your caregiver.  Ask your caregiver if you should continue all prescribed and over-the-counter medicines.  Keep all follow-up appointments with your caregiver. SEEK MEDICAL CARE IF:  You have abdominal pain and it increases or stays in one area (localizes).  You have a rash, stiff neck, or severe headache.  You are irritable, sleepy, or difficult to awaken.  You are weak, dizzy, or extremely thirsty. SEEK IMMEDIATE MEDICAL CARE IF:   You are unable to keep fluids down or you get worse despite treatment.  You have frequent episodes of vomiting or diarrhea.  You have blood or green matter (bile) in your vomit.  You have blood in your stool or your stool looks black and tarry.  You have not urinated in 6 to 8 hours, or you have only urinated a small amount of very dark urine.  You have a fever.  You faint. MAKE SURE YOU:   Understand these instructions.  Will watch your condition.  Will get help right away if you are not doing well or get worse. Document Released: 07/24/2005 Document Revised: 10/16/2011 Document Reviewed: 03/13/2011 ExitCare Patient Information 2014 [employer].  

## 2013-11-10 NOTE — Progress Notes (Signed)
Subjective:    Patient ID: Bryan Paul, male    DOB: 12-Jul-1935, 79 y.o.   MRN: [id number]  HPI  [age] year caucasian male, nonsmoker presents today for a follow-up for hypertension.  He [facility]  He began Lisinipril/HCT- 10-12.5 on 09/29/13.  Patient is complaining of weight loss, dizziness, and increased urination. Hi wife states that his pants are falling off of him Patient has 6 pound weight loss.  His BP is 138/80 and he has not taken his medication for two days.      Review of Systems  Constitutional: Negative for fever and chills.       Weight down 6 lbs.   HENT: Negative.  Negative for congestion.   Respiratory: Negative.  Negative for shortness of breath.   Cardiovascular: Negative.  Negative for chest pain.  Gastrointestinal: Negative.  Negative for nausea and vomiting.  Musculoskeletal: Negative.  Negative for gait problem and joint swelling.  Skin: Negative.   Neurological: Positive for dizziness and weakness. Negative for syncope and headaches.       He is dizzy when he stands up and is weak  Psychiatric/Behavioral: Negative.  Negative for confusion.   Past Medical History  Diagnosis Date  . Hypertension     History   Social History  . Marital Status: Married    Spouse Name: N/A    Number of Children: N/A  . Years of Education: N/A   Occupational History  . Not on file.   Social History Main Topics  . Smoking status: Former Games developermoker  . Smokeless tobacco: Not on file  . Alcohol Use: No  . Drug Use: No  . Sexual Activity: Not on file   Other Topics Concern  . Not on file   Social History Narrative  . No narrative on file    Past Surgical History  Procedure Laterality Date  . Colonscopy  09/07/2006    Family History  Problem Relation Age of Onset  . Aneurysm      No Known Allergies  Current Outpatient Prescriptions on File Prior to Visit  Medication Sig Dispense Refill  . aspirin 81 MG tablet Take 81 mg by  mouth daily.        Marland Kitchen. doxazosin (CARDURA) 4 MG tablet TAKE 1 TABLET BY MOUTH EVERY NIGHT AT BEDTIME  30 tablet  0  . LORazepam (ATIVAN) 0.5 MG tablet Take 1 tablet (0.5 mg total) by mouth 2 (two) times daily as needed for anxiety.  30 tablet  3   No current facility-administered medications on file prior to visit.    BP 138/80  Pulse 81  Ht 5\' 8"  (1.727 m)  Wt 146 lb (66.225 kg)  BMI 22.20 kg/m2  SpO2 98%chart    Objective:   Physical Exam  Constitutional: He is oriented to person, place, and time. He appears well-developed and well-nourished.  6 pound weight loss from last visit  HENT:  Head: Normocephalic.  Neck: Normal range of motion. Neck supple.  Cardiovascular: Normal rate, regular rhythm and normal heart sounds.   Pulmonary/Chest: Effort normal and breath sounds normal.  Abdominal: Soft. Bowel sounds are normal.  Musculoskeletal: Normal range of motion. He exhibits no edema.  Neurological: He is alert and oriented to person, place, and time.  Weakness   Skin: Skin is warm and dry.  Psychiatric: He has a normal mood and affect.          Assessment & Plan:  1.Dehydration-Obtain labs  2. Unexpected Weight Loss-Colonoscopy stable.  3. Hypertension-Hold medication  Plan 1. Hold BP medication.  2.  Obtain labs that include CBC, CMP, and UA.  3. Call office for BP above 150. 4 Return to office for follow-up in four weeks.

## 2013-11-11 ENCOUNTER — Telehealth: Payer: Self-pay | Admitting: Internal Medicine

## 2013-11-11 NOTE — Telephone Encounter (Signed)
Relevant patient education mailed to patient.  

## 2013-11-25 ENCOUNTER — Other Ambulatory Visit: Payer: Self-pay | Admitting: Internal Medicine

## 2013-11-26 ENCOUNTER — Other Ambulatory Visit: Payer: Self-pay

## 2013-11-26 MED ORDER — LORAZEPAM 0.5 MG PO TABS: ORAL_TABLET | ORAL | Status: DC

## 2013-11-28 ENCOUNTER — Other Ambulatory Visit: Payer: Self-pay | Admitting: Internal Medicine

## 2013-12-08 ENCOUNTER — Ambulatory Visit (INDEPENDENT_AMBULATORY_CARE_PROVIDER_SITE_OTHER): Payer: Medicare Other | Admitting: Family

## 2013-12-08 ENCOUNTER — Encounter: Payer: Self-pay | Admitting: Family

## 2013-12-08 VITALS — BP 154/90 | HR 65 | Temp 98.5°F | Ht 68.0 in | Wt 149.0 lb

## 2013-12-08 DIAGNOSIS — I10 Essential (primary) hypertension: Secondary | ICD-10-CM

## 2013-12-08 DIAGNOSIS — G4700 Insomnia, unspecified: Secondary | ICD-10-CM

## 2013-12-08 MED ORDER — LOSARTAN POTASSIUM 25 MG PO TABS: 25.0000 mg | ORAL_TABLET | Freq: Every day | ORAL | Status: DC

## 2013-12-08 NOTE — Patient Instructions (Signed)
Sodium-Controlled Diet Sodium is a mineral. It is found in many foods. Sodium may be found naturally or added during the making of a food. The most common form of sodium is salt, which is made up of sodium and chloride. Reducing your sodium intake involves changing your eating habits. The following guidelines will help you reduce the sodium in your diet:  Stop using the salt shaker.  Use salt sparingly in cooking and baking.  Substitute with sodium-free seasonings and spices.  Do not use a salt substitute (potassium chloride) without your caregiver's permission.  Include a variety of fresh, unprocessed foods in your diet.  Limit the use of processed and convenience foods that are high in sodium. USE THE FOLLOWING FOODS SPARINGLY: Breads/Starches  Commercial bread stuffing, commercial pancake or waffle mixes, coating mixes. Waffles. Croutons. Prepared (boxed or frozen) potato, rice, or noodle mixes that contain salt or sodium. Salted French fries or hash browns. Salted popcorn, breads, crackers, chips, or snack foods. Vegetables  Vegetables canned with salt or prepared in cream, butter, or cheese sauces. Sauerkraut. Tomato or vegetable juices canned with salt.  Fresh vegetables are allowed if rinsed thoroughly. Fruit  Fruit is okay to eat. Meat and Meat Substitutes  Salted or smoked meats, such as bacon or Canadian bacon, chipped or corned beef, hot dogs, salt pork, luncheon meats, pastrami, ham, or sausage. Canned or smoked fish, poultry, or meat. Processed cheese or cheese spreads, blue or Roquefort cheese. Battered or frozen fish products. Prepared spaghetti sauce. Baked beans. Reuben sandwiches. Salted nuts. Caviar. Milk  Limit buttermilk to 1 cup per week. Soups and Combination Foods  Bouillon cubes, canned or dried soups, broth, consomm. Convenience (frozen or packaged) dinners with more than 600 mg sodium. Pot pies, pizza, Asian food, fast food cheeseburgers, and specialty  sandwiches. Desserts and Sweets  Regular (salted) desserts, pie, commercial fruit snack pies, commercial snack cakes, canned puddings.  Eat desserts and sweets in moderation. Fats and Oils  Gravy mixes or canned gravy. No more than 1 to 2 tbs of salad dressing. Chip dips.  Eat fats and oils in moderation. Beverages  See those listed under the vegetables and milk groups. Condiments  Ketchup, mustard, meat sauces, salsa, regular (salted) and lite soy sauce or mustard. Dill pickles, olives, meat tenderizer. Prepared horseradish or pickle relish. Dutch-processed cocoa. Baking powder or baking soda used medicinally. Worcestershire sauce. "Light" salt. Salt substitute, unless approved by your caregiver. Document Released: 01/13/2002 Document Revised: 10/16/2011 Document Reviewed: 08/16/2009 ExitCare Patient Information 2014 [employer].  

## 2013-12-08 NOTE — Progress Notes (Signed)
Subjective:    Patient ID: Bryan Paul, male    DOB: May 05, 1935, 79 y.o.   MRN: [id number]  HPI 79 year old white male, nonsmoker is in today for recheck of hypotension with a history of hypertension. At his last office visit we discontinued lisinopril HCT. He had lightheadedness, dizziness, and weakness that has since resolved. Feels much better. Blood pressure readings at home are in the 170s with his cuff. Denies any headaches, chest pain, palpitations  Takes Ativan at bedtime for sleep it works well.  Review of Systems  Constitutional: Negative.   HENT: Negative.   Respiratory: Negative.   Cardiovascular: Negative.  Negative for palpitations and leg swelling.  Endocrine: Negative.   Genitourinary: Negative.   Musculoskeletal: Negative.   Skin: Negative.   Neurological: Negative.   Hematological: Negative.   Psychiatric/Behavioral: Negative.    Past Medical History  Diagnosis Date  . Hypertension     History   Social History  . Marital Status: Married    Spouse Name: N/A    Number of Children: N/A  . Years of Education: N/A   Occupational History  . Not on file.   Social History Main Topics  . Smoking status: Former Games developer  . Smokeless tobacco: Not on file  . Alcohol Use: No  . Drug Use: No  . Sexual Activity: Not on file   Other Topics Concern  . Not on file   Social History Narrative  . No narrative on file    Past Surgical History  Procedure Laterality Date  . Colonscopy  09/07/2006    Family History  Problem Relation Age of Onset  . Aneurysm      No Known Allergies  Current Outpatient Prescriptions on File Prior to Visit  Medication Sig Dispense Refill  . doxazosin (CARDURA) 4 MG tablet TAKE 1 TABLET BY MOUTH EVERY NIGHT AT BEDTIME  30 tablet  0  . LORazepam (ATIVAN) 0.5 MG tablet TAKE 1 TABLET BY MOUTH TWICE DAILY AS NEEDED  30 tablet  2  . aspirin 81 MG tablet Take 81 mg by mouth daily.         No current facility-administered  medications on file prior to visit.    BP 154/90  Pulse 65  Temp(Src) 98.5 F (36.9 C) (Oral)  Ht 5\' 8"  (1.727 m)  Wt 149 lb (67.586 kg)  BMI 22.66 kg/m2  SpO2 98%chart    Objective:   Physical Exam  Constitutional: He is oriented to person, place, and time. He appears well-developed and well-nourished.  HENT:  Right Ear: External ear normal.  Left Ear: External ear normal.  Nose: Nose normal.  Mouth/Throat: Oropharynx is clear and moist.  Neck: Normal range of motion. Neck supple.  Cardiovascular: Normal rate, regular rhythm and normal heart sounds.   Recheck blood pressure 158/74  Pulmonary/Chest: Effort normal and breath sounds normal.  Musculoskeletal: Normal range of motion.  Neurological: He is alert and oriented to person, place, and time.  Skin: Skin is warm and dry.  Psychiatric: He has a normal mood and affect.          Assessment & Plan:  [name] was seen today for follow-up.  Diagnoses and associated orders for this visit:  Unspecified essential hypertension  Insomnia  Other Orders - losartan (COZAAR) 25 MG tablet; Take 1 tablet (25 mg total) by mouth daily.    call the office with any questions or concerns. Recheck as scheduled, and as needed. Continue Ativan as needed at bedtime for

## 2013-12-08 NOTE — Progress Notes (Signed)
Pre visit review using our clinic review tool, if applicable. No additional management support is needed unless otherwise documented below in the visit note. 

## 2013-12-30 ENCOUNTER — Encounter: Payer: Self-pay | Admitting: Family

## 2013-12-30 ENCOUNTER — Ambulatory Visit (INDEPENDENT_AMBULATORY_CARE_PROVIDER_SITE_OTHER): Payer: Medicare Other | Admitting: Family

## 2013-12-30 VITALS — BP 160/70 | HR 72 | Temp 97.8°F | Ht 68.0 in | Wt 149.5 lb

## 2013-12-30 DIAGNOSIS — F411 Generalized anxiety disorder: Secondary | ICD-10-CM

## 2013-12-30 DIAGNOSIS — N400 Enlarged prostate without lower urinary tract symptoms: Secondary | ICD-10-CM

## 2013-12-30 DIAGNOSIS — I10 Essential (primary) hypertension: Secondary | ICD-10-CM

## 2013-12-30 MED ORDER — LOSARTAN POTASSIUM 25 MG PO TABS: 25.0000 mg | ORAL_TABLET | Freq: Every day | ORAL | Status: DC

## 2013-12-30 MED ORDER — DOXAZOSIN MESYLATE 8 MG PO TABS: ORAL_TABLET | ORAL | Status: DC

## 2013-12-30 MED ORDER — LORAZEPAM 0.5 MG PO TABS: ORAL_TABLET | ORAL | Status: DC

## 2013-12-30 NOTE — Patient Instructions (Signed)
Benign Prostatic Hypertrophy   The prostate gland is part of the reproductive system of men. A normal prostate is about the size and shape of a walnut. The prostate gland produces a fluid that is mixed with sperm to make semen. This gland surrounds the urethra and is located in front of the rectum and just below the bladder. The bladder is where urine is stored. The urethra is the tube through which urine passes from the bladder to get out of the body.  The prostate grows as a man ages. An enlarged prostate not caused by cancer is called benign prostatic hypertrophy (BPH). An enlarged prostate can press on the urethra. This can make it harder to pass urine. In the early stages of enlargement, the bladder can get by with a narrowed urethra by forcing the urine through. If the problem gets worse, medical or surgical treatment may be required.   This condition should be followed by your health care provider. The accumulation of urine in the bladder can cause infection. Back pressure and infection can progress to bladder damage and kidney (renal) failure. If needed, your health care provider may refer you to a specialist in kidney and prostate disease (urologist).  CAUSES   BPH is a common health problem in men older than 50 years. This condition is a normal part of aging. However, not all men will develop problems from this condition. If the enlargement grows away from the urethra, then there will not be any compression of the urethra and resistance to urine flow. If the growth is toward the urethra and compresses it, you will experience difficulty urinating.   SYMPTOMS   · Not able to completely empty your bladder.  · Getting up often during the night to urinate.  · Need to urinate frequently during the day.  · Difficultly starting urine flow.  · Decrease in size and strength of your urine stream.  · Dribbling after urination.  · Pain on urination (more common with infection).  · Inability to pass urine. This needs  immediate treatment.  · The development of a urinary tract infection.  DIAGNOSIS   These tests will help your health care provider understand your problem:  · A thorough history and physical examination.  · A urination history, with the number of times you urinate, the amounts of urine, the strength of the urine stream, and the feeling of emptiness or fullness after urinating.  · A postvoid bladder scan that measures any amount of urine that may remain in your bladder after you finish urinating.  · Digital rectal exam. In a rectal exam, your health care provider checks your prostate by putting a gloved, lubricated finger into your rectum to feel the back of your prostate gland. This exam detects the size of your gland and abnormal lumps or growths.  · Exam of your urine (urinalysis).  · Prostate specific antigen (PSA) screening. This is a blood test used to screen for prostate cancer.  · Rectal ultrasonography. This test uses sound waves to electronically produce a picture of your prostate gland.  TREATMENT   Once symptoms begin, your health care provider will monitor your condition. Of the men with this condition, one third will have symptoms that stabilize, one third will have symptoms that improve, and one third will have symptoms that progress in the first year.  Mild symptoms may not need treatment. Simple observation and yearly exams may be all that is required. Medicines and surgery are options for more severe problems.   Your health care provider can help you make an informed decision for what is best.  Two classes of medicines are available for relief of prostate symptoms:  · Medicines that shrink the prostate. This helps relieve symptoms. These medicines take time to work, and it may be months before any improvement is seen.  · Uncommon side effects include problems with sexual function.  · Medicines to relax the muscle of the prostate. This also relieves the obstruction by reducing any compression on the  urethra. This group of medicines work much faster than those that reduce the size of the prostate gland. Usually, one can experience improvement in days to weeks..  · Side effects can include dizziness, fatigue, lightheadedness, and retrograde ejaculation (diminished volume of ejaculate).  Several types of surgical treatments are available for relief of prostate symptoms:  · Transurethral resection of the prostate (TURP) In this treatment, an instrument is inserted through opening at the tip of the penis. It is used to cut away pieces of the inner core of the prostate. The pieces are removed through the same opening of the penis. This removes the obstruction and helps get rid of the symptoms.  · Transurethral incision (TUIP) In this procedure, small cuts are made in the prostate. This lessens the prostates pressure on the urethra.  · Transurethral microwave thermotherapy (TUMT) This procedure uses microwaves to create heat. The heat destroys and removes a small amount of prostate tissue.  · Transurethral needle ablation (TUNA) This is a procedure that uses radio frequencies to do the same as TUMT.  · Interstitial laser coagulation (ILC) This is a procedure that uses a laser to do the same as TUMT and TUNA.  · Transurethral electrovaporization (TUVP) This is a procedure that uses electrodes to do the same as the procedures listed above.  SEEK MEDICAL CARE IF:   · You develop a fever.  · There is unexplained back pain.  · Symptoms are not helped by medicines prescribed.  · You develop side effects from the medicine you are taking.  · Your urine becomes very dark or has a bad smell.  · Your lower abdomen becomes distended and you have difficulty passing your urine.  SEEK IMMEDIATE MEDICAL CARE IF:   · You are suddenly unable to urinate. This is an emergency. You should be seen immediately.  · There are large amounts of blood or clots in the urine.  · Your urinary problems become unmanageable.  · You develop  lightheadedness, severe dizziness, or you feel faint.  · You develop moderate to severe low back or flank pain.  · You develop chills or fever.  Document Released: 07/24/2005 Document Revised: 05/14/2013 Document Reviewed: 02/06/2013  Exit[employer]® Patient Information ©2014 [employer].

## 2013-12-30 NOTE — Progress Notes (Signed)
   Subjective:    Patient ID: Bryan Paul, male    DOB: 1935/02/13, 79 y.o.   MRN: [id number]  HPI 79 year old white male, nonsmoker with a history of hypertension, anxiety, BPH is in for recheck. He reports still are well. Blood pressure readings at home all 130 to 140 systolically over the 70s. Wife is concerned that he continues to have urinary frequency despite being on Cardura 4 mg. Denies any burning with urination, blood in [place], back pain or abdominal pain. Takes Ativan periodically for anxiety.   Review of Systems  Constitutional: Negative.   HENT: Negative.   Respiratory: Negative.   Cardiovascular: Negative.   Gastrointestinal: Negative.   Endocrine: Negative.   Genitourinary: Positive for urgency and frequency.  Musculoskeletal: Negative.   Skin: Negative.   Neurological: Negative.   Hematological: Negative.   Psychiatric/Behavioral: Negative.    Past Medical History  Diagnosis Date  . Hypertension     History   Social History  . Marital Status: Married    Spouse Name: N/A    Number of Children: N/A  . Years of Education: N/A   Occupational History  . Not on file.   Social History Main Topics  . Smoking status: Former Games developer  . Smokeless tobacco: Not on file  . Alcohol Use: No  . Drug Use: No  . Sexual Activity: Not on file   Other Topics Concern  . Not on file   Social History Narrative  . No narrative on file    Past Surgical History  Procedure Laterality Date  . Colonscopy  09/07/2006    Family History  Problem Relation Age of Onset  . Aneurysm      No Known Allergies  Current Outpatient Prescriptions on File Prior to Visit  Medication Sig Dispense Refill  . aspirin 81 MG tablet Take 81 mg by mouth daily.         No current facility-administered medications on file prior to visit.    BP 160/70  Pulse 72  Temp(Src) 97.8 F (36.6 C) (Oral)  Ht 5\' 8"  (1.727 m)  Wt 149 lb 8 oz (67.813 kg)  BMI 22.74 kg/m2  SpO2  98%chart     Objective:   Physical Exam  Constitutional: He is oriented to person, place, and time. He appears well-developed and well-nourished.  HENT:  Right Ear: External ear normal.  Left Ear: External ear normal.  Nose: Nose normal.  Mouth/Throat: Oropharynx is clear and moist.  Neck: Normal range of motion. Neck supple.  Cardiovascular: Normal rate, regular rhythm and normal heart sounds.   Pulmonary/Chest: Effort normal and breath sounds normal.  Abdominal: Soft. Bowel sounds are normal.  Musculoskeletal: Normal range of motion.  Neurological: He is alert and oriented to person, place, and time.  Skin: Skin is warm and dry.  Psychiatric: He has a normal mood and affect.          Assessment & Plan:  Bryan Paul was seen today for hypertension and medication refill.  Diagnoses and associated orders for this visit:  Unspecified essential hypertension  BPH (benign prostatic hypertrophy)  Anxiety state, unspecified  Other Orders - losartan (COZAAR) 25 MG tablet; Take 1 tablet (25 mg total) by mouth daily. - doxazosin (CARDURA) 8 MG tablet; TAKE 1 TABLET BY MOUTH EVERY NIGHT AT BEDTIME - LORazepam (ATIVAN) 0.5 MG tablet; TAKE 1 TABLET BY MOUTH TWICE DAILY AS NEEDED   Call the office with any questions or concerns. Recheck as scheduled, and as needed.

## 2013-12-30 NOTE — Progress Notes (Signed)
Pre visit review using our clinic review tool, if applicable. No additional management support is needed unless otherwise documented below in the visit note. 

## 2014-03-23 ENCOUNTER — Encounter: Payer: Self-pay | Admitting: Family

## 2014-03-23 ENCOUNTER — Ambulatory Visit (INDEPENDENT_AMBULATORY_CARE_PROVIDER_SITE_OTHER): Payer: Medicare Other | Admitting: Family

## 2014-03-23 VITALS — BP 118/70 | HR 88 | Temp 97.7°F | Ht 68.0 in | Wt 149.0 lb

## 2014-03-23 DIAGNOSIS — N400 Enlarged prostate without lower urinary tract symptoms: Secondary | ICD-10-CM

## 2014-03-23 DIAGNOSIS — F411 Generalized anxiety disorder: Secondary | ICD-10-CM

## 2014-03-23 DIAGNOSIS — H902 Conductive hearing loss, unspecified: Secondary | ICD-10-CM

## 2014-03-23 DIAGNOSIS — I10 Essential (primary) hypertension: Secondary | ICD-10-CM

## 2014-03-23 LAB — BASIC METABOLIC PANEL
BUN: 16 mg/dL (ref 6–23)
CO2: 26 meq/L (ref 19–32)
Calcium: 9.3 mg/dL (ref 8.4–10.5)
Chloride: 103 mEq/L (ref 96–112)
Creatinine, Ser: 0.7 mg/dL (ref 0.4–1.5)
GFR: 108.92 mL/min (ref >60.00)
Glucose, Bld: 84 mg/dL (ref 70–99)
Potassium: 4.3 mEq/L (ref 3.5–5.1)
SODIUM: 137 meq/L (ref 135–145)

## 2014-03-23 LAB — HEPATIC FUNCTION PANEL
ALK PHOS: 64 U/L (ref 39–117)
ALT: 19 U/L (ref 0–53)
AST: 21 U/L (ref 0–37)
Albumin: 4.1 g/dL (ref 3.5–5.2)
BILIRUBIN TOTAL: 0.9 mg/dL (ref 0.2–1.2)
Bilirubin, Direct: 0.1 mg/dL (ref 0.0–0.3)
Total Protein: 6.7 g/dL (ref 6.0–8.3)

## 2014-03-23 NOTE — Progress Notes (Signed)
Pre visit review using our clinic review tool, if applicable. No additional management support is needed unless otherwise documented below in the visit note. 

## 2014-03-23 NOTE — Patient Instructions (Signed)
Cardiac Diet This diet can help prevent heart disease and stroke. Many factors influence your heart health, including eating and exercise habits. Coronary risk rises a lot with abnormal blood fat (lipid) levels. Cardiac meal planning includes limiting unhealthy fats, increasing healthy fats, and making other small dietary changes. General guidelines are as follows:  Adjust calorie intake to reach and maintain desirable body weight.  Limit total fat intake to less than 30% of total calories. Saturated fat should be less than 7% of calories.  Saturated fats are found in animal products and in some vegetable products. Saturated vegetable fats are found in coconut oil, cocoa butter, palm oil, and palm kernel oil. Read labels carefully to avoid these products as much as possible. Use butter in moderation. Choose tub margarines and oils that have 2 grams of fat or less. Good cooking oils are canola and olive oils.  Practice low-fat cooking techniques. Do not fry food. Instead, broil, bake, boil, steam, grill, roast on a rack, stir-fry, or microwave it. Other fat reducing suggestions include:  Remove the skin from poultry.  Remove all visible fat from meats.  Skim the fat off stews, soups, and gravies before serving them.  Steam vegetables in water or broth instead of sauting them in fat.  Avoid foods with trans fat (or hydrogenated oils), such as commercially fried foods and commercially baked goods. Commercial shortening and deep-frying fats will contain trans fat.  Increase intake of fruits, vegetables, whole grains, and legumes to replace foods high in fat.  Increase consumption of nuts, legumes, and seeds to at least 4 servings weekly. One serving of a legume equals  cup, and 1 serving of nuts or seeds equals  cup.  Choose whole grains more often. Have 3 servings per day (a serving is 1 ounce [oz]).  Eat 4 to 5 servings of vegetables per day. A serving of vegetables is 1 cup of raw leafy  vegetables;  cup of raw or cooked cut-up vegetables;  cup of vegetable juice.  Eat 4 to 5 servings of fruit per day. A serving of fruit is 1 medium whole fruit;  cup of dried fruit;  cup of fresh, frozen, or canned fruit;  cup of 100% fruit juice.  Increase your intake of dietary fiber to 20 to 30 grams per day. Insoluble fiber may help lower your risk of heart disease and may help curb your appetite.  Soluble fiber binds cholesterol to be removed from the blood. Foods high in soluble fiber are dried beans, citrus fruits, oats, apples, bananas, broccoli, Brussels sprouts, and eggplant.  Try to include foods fortified with plant sterols or stanols, such as yogurt, breads, juices, or margarines. Choose several fortified foods to achieve a daily intake of 2 to 3 grams of plant sterols or stanols.  Foods with omega-3 fats can help reduce your risk of heart disease. Aim to have a 3.5 oz portion of fatty fish twice per week, such as salmon, mackerel, albacore tuna, sardines, lake trout, or herring. If you wish to take a fish oil supplement, choose one that contains 1 gram of both DHA and EPA.  Limit processed meats to 2 servings (3 oz portion) weekly.  Limit the sodium in your diet to 1500 milligrams (mg) per day. If you have high blood pressure, talk to a registered dietitian about a DASH (Dietary Approaches to Stop Hypertension) eating plan.  Limit sweets and beverages with added sugar, such as soda, to no more than 5 servings per week. One   serving is:   1 tablespoon sugar.  1 tablespoon jelly or jam.   cup sorbet.  1 cup lemonade.   cup regular soda. CHOOSING FOODS Starches  Allowed: Breads: All kinds (wheat, rye, raisin, white, oatmeal, Italian, French, and English muffin bread). Low-fat rolls: English muffins, frankfurter and hamburger buns, bagels, pita bread, tortillas (not fried). Pancakes, waffles, biscuits, and muffins made with recommended oil.  Avoid: Products made with  saturated or trans fats, oils, or whole milk products. Butter rolls, cheese breads, croissants. Commercial doughnuts, muffins, sweet rolls, biscuits, waffles, pancakes, store-bought mixes. Crackers  Allowed: Low-fat crackers and snacks: Animal, graham, rye, saltine (with recommended oil, no lard), oyster, and matzo crackers. Bread sticks, melba toast, rusks, flatbread, pretzels, and light popcorn.  Avoid: High-fat crackers: cheese crackers, butter crackers, and those made with coconut, palm oil, or trans fat (hydrogenated oils). Buttered popcorn. Cereals  Allowed: Hot or cold whole-grain cereals.  Avoid: Cereals containing coconut, hydrogenated vegetable fat, or animal fat. Potatoes / Pasta / Rice  Allowed: All kinds of potatoes, rice, and pasta (such as macaroni, spaghetti, and noodles).  Avoid: Pasta or rice prepared with cream sauce or high-fat cheese. Chow mein noodles, French fries. Vegetables  Allowed: All vegetables and vegetable juices.  Avoid: Fried vegetables. Vegetables in cream, butter, or high-fat cheese sauces. Limit coconut. Fruit in cream or custard. Protein  Allowed: Limit your intake of meat, seafood, and poultry to no more than 6 oz (cooked weight) per day. All lean, well-trimmed beef, veal, pork, and lamb. All chicken and turkey without skin. All fish and shellfish. Wild game: wild duck, rabbit, pheasant, and venison. Egg whites or low-cholesterol egg substitutes may be used as desired. Meatless dishes: recipes with dried beans, peas, lentils, and tofu (soybean curd). Seeds and nuts: all seeds and most nuts.  Avoid: Prime grade and other heavily marbled and fatty meats, such as short ribs, spare ribs, rib eye roast or steak, frankfurters, sausage, bacon, and high-fat luncheon meats, mutton. Caviar. Commercially fried fish. Domestic duck, goose, venison sausage. Organ meats: liver, gizzard, heart, chitterlings, brains, kidney, sweetbreads. Dairy  Allowed: Low-fat  cheeses: nonfat or low-fat cottage cheese (1% or 2% fat), cheeses made with part skim milk, such as mozzarella, farmers, string, or ricotta. (Cheeses should be labeled no more than 2 to 6 grams fat per oz.). Skim (or 1%) milk: liquid, powdered, or evaporated. Buttermilk made with low-fat milk. Drinks made with skim or low-fat milk or cocoa. Chocolate milk or cocoa made with skim or low-fat (1%) milk. Nonfat or low-fat yogurt.  Avoid: Whole milk cheeses, including colby, cheddar, muenster, Monterey Jack, Havarti, Brie, Camembert, American, Swiss, and blue. Creamed cottage cheese, cream cheese. Whole milk and whole milk products, including buttermilk or yogurt made from whole milk, drinks made from whole milk. Condensed milk, evaporated whole milk, and 2% milk. Soups and Combination Foods  Allowed: Low-fat low-sodium soups: broth, dehydrated soups, homemade broth, soups with the fat removed, homemade cream soups made with skim or low-fat milk. Low-fat spaghetti, lasagna, chili, and Spanish rice if low-fat ingredients and low-fat cooking techniques are used.  Avoid: Cream soups made with whole milk, cream, or high-fat cheese. All other soups. Desserts and Sweets  Allowed: Sherbet, fruit ices, gelatins, meringues, and angel food cake. Homemade desserts with recommended fats, oils, and milk products. Jam, jelly, honey, marmalade, sugars, and syrups. Pure sugar candy, such as gum drops, hard candy, jelly beans, marshmallows, mints, and small amounts of dark chocolate.  Avoid: Commercially prepared   cakes, pies, cookies, frosting, pudding, or mixes for these products. Desserts containing whole milk products, chocolate, coconut, lard, palm oil, or palm kernel oil. Ice cream or ice cream drinks. Candy that contains chocolate, coconut, butter, hydrogenated fat, or unknown ingredients. Buttered syrups. Fats and Oils  Allowed: Vegetable oils: safflower, sunflower, corn, soybean, cottonseed, sesame, canola, olive,  or peanut. Non-hydrogenated margarines. Salad dressing or mayonnaise: homemade or commercial, made with a recommended oil. Low or nonfat salad dressing or mayonnaise.  Limit added fats and oils to 6 to 8 tsp per day (includes fats used in cooking, baking, salads, and spreads on bread). Remember to count the "hidden fats" in foods.  Avoid: Solid fats and shortenings: butter, lard, salt pork, bacon drippings. Gravy containing meat fat, shortening, or suet. Cocoa butter, coconut. Coconut oil, palm oil, palm kernel oil, or hydrogenated oils: these ingredients are often used in bakery products, nondairy creamers, whipped toppings, candy, and commercially fried foods. Read labels carefully. Salad dressings made of unknown oils, sour cream, or cheese, such as blue cheese and Roquefort. Cream, all kinds: half-and-half, light, heavy, or whipping. Sour cream or cream cheese (even if "light" or low-fat). Nondairy cream substitutes: coffee creamers and sour cream substitutes made with palm, palm kernel, hydrogenated oils, or coconut oil. Beverages  Allowed: Coffee (regular or decaffeinated), tea. Diet carbonated beverages, mineral water. Alcohol: Check with your caregiver. Moderation is recommended.  Avoid: Whole milk, regular sodas, and juice drinks with added sugar. Condiments  Allowed: All seasonings and condiments. Cocoa powder. "Cream" sauces made with recommended ingredients.  Avoid: Carob powder made with hydrogenated fats. SAMPLE MENU Breakfast   cup orange juice   cup oatmeal  1 slice toast  1 tsp margarine  1 cup skim milk Lunch  Turkey sandwich with 2 oz turkey, 2 slices bread  Lettuce and tomato slices  Fresh fruit  Carrot sticks  Coffee or tea Snack  Fresh fruit or low-fat crackers Dinner  3 oz lean ground beef  1 baked potato  1 tsp margarine   cup asparagus  Lettuce salad  1 tbs non-creamy dressing   cup peach slices  1 cup skim milk Document Released:  05/02/2008 Document Revised: 01/23/2012 Document Reviewed: 09/23/2013 Exit[employer] Patient Information 2015 [employer]. This information is not intended to replace advice given to you by your health care provider. Make sure you discuss any questions you have with your health care provider.  

## 2014-03-23 NOTE — Progress Notes (Signed)
Subjective:    Patient ID: Bryan Paul, male    DOB: 1935-01-23, 79 y.o.   MRN: [id number]  HPI  79 year old white male, nonsmoker with a history of hypertension, BPH, conductive hearing loss, and anxiety is in today for recheck. Reports feeling well. Denies any concerns. Wife reports a random blood pressure spike last week 170/119. Has not had any issues since that time. Denies any chest pain or palpitations. She has a home blood pressure monitoring cuff.  Review of Systems  Constitutional: Negative.   HENT: Negative.   Respiratory: Negative.   Cardiovascular: Negative.   Gastrointestinal: Negative.   Endocrine: Negative.   Genitourinary: Negative.   Musculoskeletal: Negative.   Skin: Negative.   Neurological: Negative.   Hematological: Negative.   Psychiatric/Behavioral: Negative.    Past Medical History  Diagnosis Date  . Hypertension     History   Social History  . Marital Status: Married    Spouse Name: N/A    Number of Children: N/A  . Years of Education: N/A   Occupational History  . Not on file.   Social History Main Topics  . Smoking status: Former Games developermoker  . Smokeless tobacco: Not on file  . Alcohol Use: No  . Drug Use: No  . Sexual Activity: Not on file   Other Topics Concern  . Not on file   Social History Narrative  . No narrative on file    Past Surgical History  Procedure Laterality Date  . Colonscopy  09/07/2006    Family History  Problem Relation Age of Onset  . Aneurysm      No Known Allergies  Current Outpatient Prescriptions on File Prior to Visit  Medication Sig Dispense Refill  . aspirin 81 MG tablet Take 81 mg by mouth daily.        Marland Kitchen. doxazosin (CARDURA) 8 MG tablet TAKE 1 TABLET BY MOUTH EVERY NIGHT AT BEDTIME  30 tablet  5  . LORazepam (ATIVAN) 0.5 MG tablet TAKE 1 TABLET BY MOUTH TWICE DAILY AS NEEDED  60 tablet  2  . losartan (COZAAR) 25 MG tablet Take 1 tablet (25 mg total) by mouth daily.  30 tablet  5   No  current facility-administered medications on file prior to visit.    BP 118/70  Pulse 88  Temp(Src) 97.7 F (36.5 C) (Oral)  Ht 5\' 8"  (1.727 m)  Wt 149 lb (67.586 kg)  BMI 22.66 kg/m2  SpO2 97%chart    Objective:   Physical Exam  Constitutional: He is oriented to person, place, and time. He appears well-developed and well-nourished.  HENT:  Right Ear: External ear normal.  Left Ear: External ear normal.  Nose: Nose normal.  Mouth/Throat: Oropharynx is clear and moist.  Neck: Normal range of motion. Neck supple. No thyromegaly present.  Cardiovascular: Normal rate, regular rhythm and normal heart sounds.   Pulmonary/Chest: Effort normal and breath sounds normal.  Musculoskeletal: Normal range of motion.  Neurological: He is alert and oriented to person, place, and time.  Skin: Skin is warm and dry.  Psychiatric: He has a normal mood and affect.          Assessment & Plan:  Bryan Paul was seen today for follow-up.  Diagnoses and associated orders for this visit:  Unspecified essential hypertension - Basic Metabolic Panel - Hepatic Function Panel  Anxiety state, unspecified  BPH (benign prostatic hypertrophy)  Conductive hearing loss     Continue current medications. Call the office for appointment blood  pressure spikes. Right now, he stable. I suspect that this may have been an error and a blood pressure reading. Recheck in 6 months, pending labs and sooner if needed.

## 2014-05-18 ENCOUNTER — Telehealth: Payer: Self-pay | Admitting: Internal Medicine

## 2014-05-18 ENCOUNTER — Other Ambulatory Visit: Payer: Self-pay | Admitting: Family

## 2014-05-18 NOTE — Telephone Encounter (Signed)
Pt needs an appointment to est care with a provider. Please call pt to schedule

## 2014-05-18 NOTE — Telephone Encounter (Signed)
Pt states he was told that you would fill his meds when needed.  Pt request rx LORazepam (ATIVAN) 0.5 MG tablet.  Wife states you had filled his BP med several times.  Made pt aware he will need a 30 min appt to establish. Pt understood. [facility]

## 2014-05-19 MED ORDER — LORAZEPAM 0.5 MG PO TABS: ORAL_TABLET | ORAL | Status: DC

## 2014-05-19 NOTE — Telephone Encounter (Signed)
Rx printed and faxed. No further refills without est appt

## 2014-05-22 ENCOUNTER — Ambulatory Visit (INDEPENDENT_AMBULATORY_CARE_PROVIDER_SITE_OTHER): Payer: Medicare Other

## 2014-05-22 DIAGNOSIS — Z23 Encounter for immunization: Secondary | ICD-10-CM

## 2014-06-15 ENCOUNTER — Other Ambulatory Visit: Payer: Self-pay | Admitting: Family

## 2014-07-15 ENCOUNTER — Other Ambulatory Visit: Payer: Self-pay | Admitting: Family

## 2014-08-06 ENCOUNTER — Other Ambulatory Visit: Payer: Self-pay | Admitting: Family

## 2014-09-17 ENCOUNTER — Other Ambulatory Visit: Payer: Self-pay | Admitting: Family

## 2014-09-22 ENCOUNTER — Other Ambulatory Visit: Payer: Self-pay | Admitting: Family

## 2014-10-13 ENCOUNTER — Other Ambulatory Visit: Payer: Self-pay | Admitting: Family

## 2015-10-06 DIAGNOSIS — S41119A Laceration without foreign body of unspecified upper arm, initial encounter: Secondary | ICD-10-CM | POA: Diagnosis not present

## 2015-10-06 DIAGNOSIS — I10 Essential (primary) hypertension: Secondary | ICD-10-CM | POA: Diagnosis not present

## 2015-10-06 DIAGNOSIS — Z0000 Encounter for general adult medical examination without abnormal findings: Secondary | ICD-10-CM | POA: Diagnosis not present

## 2015-10-06 DIAGNOSIS — F419 Anxiety disorder, unspecified: Secondary | ICD-10-CM | POA: Diagnosis not present

## 2015-10-06 DIAGNOSIS — Z79899 Other long term (current) drug therapy: Secondary | ICD-10-CM | POA: Diagnosis not present

## 2015-10-06 DIAGNOSIS — Z23 Encounter for immunization: Secondary | ICD-10-CM | POA: Diagnosis not present

## 2016-01-11 DIAGNOSIS — H5441 Blindness, right eye, normal vision left eye: Secondary | ICD-10-CM | POA: Diagnosis not present

## 2016-01-11 DIAGNOSIS — I10 Essential (primary) hypertension: Secondary | ICD-10-CM | POA: Diagnosis not present

## 2016-01-11 DIAGNOSIS — R000 Tachycardia, unspecified: Secondary | ICD-10-CM | POA: Diagnosis not present

## 2016-01-11 DIAGNOSIS — N400 Enlarged prostate without lower urinary tract symptoms: Secondary | ICD-10-CM | POA: Diagnosis not present

## 2016-01-11 DIAGNOSIS — R55 Syncope and collapse: Secondary | ICD-10-CM | POA: Diagnosis not present

## 2016-01-11 DIAGNOSIS — I4892 Unspecified atrial flutter: Secondary | ICD-10-CM | POA: Diagnosis not present

## 2016-01-12 DIAGNOSIS — R55 Syncope and collapse: Secondary | ICD-10-CM | POA: Diagnosis not present

## 2016-01-12 DIAGNOSIS — I340 Nonrheumatic mitral (valve) insufficiency: Secondary | ICD-10-CM | POA: Diagnosis not present

## 2016-01-12 DIAGNOSIS — N400 Enlarged prostate without lower urinary tract symptoms: Secondary | ICD-10-CM

## 2016-01-12 DIAGNOSIS — R000 Tachycardia, unspecified: Secondary | ICD-10-CM | POA: Diagnosis not present

## 2016-01-12 DIAGNOSIS — I4892 Unspecified atrial flutter: Secondary | ICD-10-CM | POA: Diagnosis not present

## 2016-01-12 DIAGNOSIS — I10 Essential (primary) hypertension: Secondary | ICD-10-CM

## 2016-01-12 DIAGNOSIS — I350 Nonrheumatic aortic (valve) stenosis: Secondary | ICD-10-CM | POA: Diagnosis not present

## 2016-01-12 DIAGNOSIS — I501 Left ventricular failure: Secondary | ICD-10-CM | POA: Diagnosis not present

## 2016-01-13 DIAGNOSIS — I4892 Unspecified atrial flutter: Secondary | ICD-10-CM | POA: Diagnosis not present

## 2016-01-13 DIAGNOSIS — I272 Other secondary pulmonary hypertension: Secondary | ICD-10-CM | POA: Diagnosis not present

## 2016-01-13 DIAGNOSIS — I517 Cardiomegaly: Secondary | ICD-10-CM | POA: Diagnosis not present

## 2016-01-13 DIAGNOSIS — I340 Nonrheumatic mitral (valve) insufficiency: Secondary | ICD-10-CM | POA: Diagnosis not present

## 2016-01-20 DIAGNOSIS — E860 Dehydration: Secondary | ICD-10-CM | POA: Diagnosis not present

## 2016-01-20 DIAGNOSIS — I10 Essential (primary) hypertension: Secondary | ICD-10-CM | POA: Diagnosis not present

## 2016-01-20 DIAGNOSIS — I482 Chronic atrial fibrillation: Secondary | ICD-10-CM | POA: Diagnosis not present

## 2016-01-20 DIAGNOSIS — N401 Enlarged prostate with lower urinary tract symptoms: Secondary | ICD-10-CM | POA: Diagnosis not present

## 2016-01-20 DIAGNOSIS — R351 Nocturia: Secondary | ICD-10-CM | POA: Diagnosis not present

## 2016-01-24 DIAGNOSIS — I499 Cardiac arrhythmia, unspecified: Secondary | ICD-10-CM | POA: Diagnosis not present

## 2016-02-15 DIAGNOSIS — I4892 Unspecified atrial flutter: Secondary | ICD-10-CM | POA: Diagnosis not present

## 2016-02-15 DIAGNOSIS — R0609 Other forms of dyspnea: Secondary | ICD-10-CM | POA: Diagnosis not present

## 2016-02-15 DIAGNOSIS — I420 Dilated cardiomyopathy: Secondary | ICD-10-CM | POA: Diagnosis not present

## 2016-02-16 DIAGNOSIS — I4892 Unspecified atrial flutter: Secondary | ICD-10-CM | POA: Diagnosis not present

## 2016-02-16 DIAGNOSIS — I420 Dilated cardiomyopathy: Secondary | ICD-10-CM | POA: Diagnosis not present

## 2016-02-16 DIAGNOSIS — R0609 Other forms of dyspnea: Secondary | ICD-10-CM | POA: Diagnosis not present

## 2016-02-18 DIAGNOSIS — I4891 Unspecified atrial fibrillation: Secondary | ICD-10-CM | POA: Diagnosis not present

## 2016-02-18 DIAGNOSIS — I4892 Unspecified atrial flutter: Secondary | ICD-10-CM | POA: Diagnosis not present

## 2016-02-18 DIAGNOSIS — Z01818 Encounter for other preprocedural examination: Secondary | ICD-10-CM | POA: Diagnosis not present

## 2016-02-18 DIAGNOSIS — I480 Paroxysmal atrial fibrillation: Secondary | ICD-10-CM | POA: Diagnosis not present

## 2016-02-21 DIAGNOSIS — I4892 Unspecified atrial flutter: Secondary | ICD-10-CM | POA: Diagnosis not present

## 2016-03-13 DIAGNOSIS — Z79899 Other long term (current) drug therapy: Secondary | ICD-10-CM | POA: Diagnosis not present

## 2016-03-13 DIAGNOSIS — Z1389 Encounter for screening for other disorder: Secondary | ICD-10-CM | POA: Diagnosis not present

## 2016-03-13 DIAGNOSIS — I482 Chronic atrial fibrillation: Secondary | ICD-10-CM | POA: Diagnosis not present

## 2016-03-13 DIAGNOSIS — I10 Essential (primary) hypertension: Secondary | ICD-10-CM | POA: Diagnosis not present

## 2016-03-13 DIAGNOSIS — Z9181 History of falling: Secondary | ICD-10-CM | POA: Diagnosis not present

## 2016-03-20 DIAGNOSIS — I420 Dilated cardiomyopathy: Secondary | ICD-10-CM | POA: Diagnosis not present

## 2016-03-20 DIAGNOSIS — I483 Typical atrial flutter: Secondary | ICD-10-CM | POA: Diagnosis not present

## 2016-03-23 DIAGNOSIS — I483 Typical atrial flutter: Secondary | ICD-10-CM | POA: Diagnosis not present

## 2016-03-23 DIAGNOSIS — I420 Dilated cardiomyopathy: Secondary | ICD-10-CM | POA: Diagnosis not present

## 2016-04-13 DIAGNOSIS — I4892 Unspecified atrial flutter: Secondary | ICD-10-CM | POA: Diagnosis not present

## 2016-04-13 DIAGNOSIS — I420 Dilated cardiomyopathy: Secondary | ICD-10-CM | POA: Diagnosis not present

## 2016-04-13 DIAGNOSIS — I493 Ventricular premature depolarization: Secondary | ICD-10-CM | POA: Diagnosis not present

## 2016-05-04 DIAGNOSIS — H2513 Age-related nuclear cataract, bilateral: Secondary | ICD-10-CM | POA: Diagnosis not present

## 2016-05-04 DIAGNOSIS — H348322 Tributary (branch) retinal vein occlusion, left eye, stable: Secondary | ICD-10-CM | POA: Diagnosis not present

## 2016-05-18 DIAGNOSIS — Z23 Encounter for immunization: Secondary | ICD-10-CM | POA: Diagnosis not present

## 2016-05-26 DIAGNOSIS — I10 Essential (primary) hypertension: Secondary | ICD-10-CM | POA: Diagnosis not present

## 2016-05-26 DIAGNOSIS — I482 Chronic atrial fibrillation: Secondary | ICD-10-CM | POA: Diagnosis not present

## 2016-05-26 DIAGNOSIS — N401 Enlarged prostate with lower urinary tract symptoms: Secondary | ICD-10-CM | POA: Diagnosis not present

## 2016-05-26 DIAGNOSIS — R351 Nocturia: Secondary | ICD-10-CM | POA: Diagnosis not present

## 2016-05-26 DIAGNOSIS — Z79899 Other long term (current) drug therapy: Secondary | ICD-10-CM | POA: Diagnosis not present

## 2016-06-08 DIAGNOSIS — R58 Hemorrhage, not elsewhere classified: Secondary | ICD-10-CM | POA: Diagnosis not present

## 2016-06-28 ENCOUNTER — Encounter: Payer: Self-pay | Admitting: Gastroenterology

## 2016-07-17 DIAGNOSIS — I493 Ventricular premature depolarization: Secondary | ICD-10-CM | POA: Diagnosis not present

## 2016-07-17 DIAGNOSIS — R0609 Other forms of dyspnea: Secondary | ICD-10-CM | POA: Diagnosis not present

## 2016-07-17 DIAGNOSIS — I483 Typical atrial flutter: Secondary | ICD-10-CM | POA: Diagnosis not present

## 2016-07-17 DIAGNOSIS — I420 Dilated cardiomyopathy: Secondary | ICD-10-CM | POA: Diagnosis not present

## 2016-08-08 DIAGNOSIS — I2510 Atherosclerotic heart disease of native coronary artery without angina pectoris: Secondary | ICD-10-CM | POA: Diagnosis not present

## 2016-08-08 DIAGNOSIS — Z9861 Coronary angioplasty status: Secondary | ICD-10-CM | POA: Diagnosis not present

## 2016-08-16 DIAGNOSIS — J40 Bronchitis, not specified as acute or chronic: Secondary | ICD-10-CM | POA: Diagnosis not present

## 2016-08-16 DIAGNOSIS — J329 Chronic sinusitis, unspecified: Secondary | ICD-10-CM | POA: Diagnosis not present

## 2016-08-17 ENCOUNTER — Encounter: Payer: Self-pay | Admitting: Gastroenterology

## 2016-10-18 DIAGNOSIS — I483 Typical atrial flutter: Secondary | ICD-10-CM | POA: Diagnosis not present

## 2016-10-18 DIAGNOSIS — R0609 Other forms of dyspnea: Secondary | ICD-10-CM | POA: Diagnosis not present

## 2016-10-18 DIAGNOSIS — I420 Dilated cardiomyopathy: Secondary | ICD-10-CM | POA: Diagnosis not present

## 2016-10-31 DIAGNOSIS — Z7901 Long term (current) use of anticoagulants: Secondary | ICD-10-CM | POA: Diagnosis not present

## 2016-10-31 DIAGNOSIS — I420 Dilated cardiomyopathy: Secondary | ICD-10-CM | POA: Diagnosis not present

## 2016-10-31 DIAGNOSIS — I4892 Unspecified atrial flutter: Secondary | ICD-10-CM | POA: Diagnosis not present

## 2016-10-31 DIAGNOSIS — I493 Ventricular premature depolarization: Secondary | ICD-10-CM | POA: Diagnosis not present

## 2016-10-31 DIAGNOSIS — R5382 Chronic fatigue, unspecified: Secondary | ICD-10-CM | POA: Diagnosis not present

## 2016-11-14 DIAGNOSIS — Z6826 Body mass index (BMI) 26.0-26.9, adult: Secondary | ICD-10-CM | POA: Diagnosis not present

## 2016-11-14 DIAGNOSIS — Z0000 Encounter for general adult medical examination without abnormal findings: Secondary | ICD-10-CM | POA: Diagnosis not present

## 2016-11-14 DIAGNOSIS — N401 Enlarged prostate with lower urinary tract symptoms: Secondary | ICD-10-CM | POA: Diagnosis not present

## 2016-11-14 DIAGNOSIS — R351 Nocturia: Secondary | ICD-10-CM | POA: Diagnosis not present

## 2016-11-14 DIAGNOSIS — F419 Anxiety disorder, unspecified: Secondary | ICD-10-CM | POA: Diagnosis not present

## 2016-11-14 DIAGNOSIS — Z79899 Other long term (current) drug therapy: Secondary | ICD-10-CM | POA: Diagnosis not present

## 2016-11-14 DIAGNOSIS — I482 Chronic atrial fibrillation: Secondary | ICD-10-CM | POA: Diagnosis not present

## 2016-11-14 DIAGNOSIS — I10 Essential (primary) hypertension: Secondary | ICD-10-CM | POA: Diagnosis not present

## 2016-12-11 DIAGNOSIS — I4892 Unspecified atrial flutter: Secondary | ICD-10-CM | POA: Diagnosis not present

## 2017-01-04 DIAGNOSIS — Z6827 Body mass index (BMI) 27.0-27.9, adult: Secondary | ICD-10-CM | POA: Diagnosis not present

## 2017-01-04 DIAGNOSIS — R5383 Other fatigue: Secondary | ICD-10-CM | POA: Diagnosis not present

## 2017-01-11 DIAGNOSIS — D510 Vitamin B12 deficiency anemia due to intrinsic factor deficiency: Secondary | ICD-10-CM | POA: Diagnosis not present

## 2017-01-12 DIAGNOSIS — D510 Vitamin B12 deficiency anemia due to intrinsic factor deficiency: Secondary | ICD-10-CM | POA: Diagnosis not present

## 2017-01-16 DIAGNOSIS — D510 Vitamin B12 deficiency anemia due to intrinsic factor deficiency: Secondary | ICD-10-CM | POA: Diagnosis not present

## 2017-01-26 DIAGNOSIS — D510 Vitamin B12 deficiency anemia due to intrinsic factor deficiency: Secondary | ICD-10-CM | POA: Diagnosis not present

## 2017-02-08 DIAGNOSIS — D510 Vitamin B12 deficiency anemia due to intrinsic factor deficiency: Secondary | ICD-10-CM | POA: Diagnosis not present

## 2017-02-08 DIAGNOSIS — R05 Cough: Secondary | ICD-10-CM | POA: Diagnosis not present

## 2017-02-08 DIAGNOSIS — E860 Dehydration: Secondary | ICD-10-CM | POA: Diagnosis not present

## 2017-02-08 DIAGNOSIS — Z6827 Body mass index (BMI) 27.0-27.9, adult: Secondary | ICD-10-CM | POA: Diagnosis not present

## 2017-03-06 DIAGNOSIS — I420 Dilated cardiomyopathy: Secondary | ICD-10-CM

## 2017-03-06 DIAGNOSIS — I4892 Unspecified atrial flutter: Secondary | ICD-10-CM | POA: Insufficient documentation

## 2017-03-06 HISTORY — DX: Unspecified atrial flutter: I48.92

## 2017-03-06 HISTORY — DX: Dilated cardiomyopathy: I42.0

## 2017-05-17 DIAGNOSIS — I4892 Unspecified atrial flutter: Secondary | ICD-10-CM | POA: Diagnosis not present

## 2017-05-17 DIAGNOSIS — R5383 Other fatigue: Secondary | ICD-10-CM

## 2017-05-17 DIAGNOSIS — I429 Cardiomyopathy, unspecified: Secondary | ICD-10-CM | POA: Diagnosis not present

## 2017-05-17 HISTORY — DX: Other fatigue: R53.83

## 2017-05-24 DIAGNOSIS — R5383 Other fatigue: Secondary | ICD-10-CM | POA: Diagnosis not present

## 2017-05-24 DIAGNOSIS — I4892 Unspecified atrial flutter: Secondary | ICD-10-CM | POA: Diagnosis not present

## 2017-06-08 DIAGNOSIS — Z23 Encounter for immunization: Secondary | ICD-10-CM | POA: Diagnosis not present

## 2017-06-11 DIAGNOSIS — R5383 Other fatigue: Secondary | ICD-10-CM | POA: Diagnosis not present

## 2017-06-11 DIAGNOSIS — I429 Cardiomyopathy, unspecified: Secondary | ICD-10-CM | POA: Diagnosis not present

## 2017-06-11 DIAGNOSIS — I4892 Unspecified atrial flutter: Secondary | ICD-10-CM | POA: Diagnosis not present

## 2017-10-16 DIAGNOSIS — Z6828 Body mass index (BMI) 28.0-28.9, adult: Secondary | ICD-10-CM | POA: Diagnosis not present

## 2017-10-16 DIAGNOSIS — Z1339 Encounter for screening examination for other mental health and behavioral disorders: Secondary | ICD-10-CM | POA: Diagnosis not present

## 2017-10-16 DIAGNOSIS — Z9181 History of falling: Secondary | ICD-10-CM | POA: Diagnosis not present

## 2017-10-16 DIAGNOSIS — Z79899 Other long term (current) drug therapy: Secondary | ICD-10-CM | POA: Diagnosis not present

## 2017-10-16 DIAGNOSIS — I10 Essential (primary) hypertension: Secondary | ICD-10-CM | POA: Diagnosis not present

## 2017-10-16 DIAGNOSIS — Z1331 Encounter for screening for depression: Secondary | ICD-10-CM | POA: Diagnosis not present

## 2017-10-16 DIAGNOSIS — K625 Hemorrhage of anus and rectum: Secondary | ICD-10-CM | POA: Diagnosis not present

## 2017-10-16 DIAGNOSIS — I482 Chronic atrial fibrillation: Secondary | ICD-10-CM | POA: Diagnosis not present

## 2017-12-04 DIAGNOSIS — Z0000 Encounter for general adult medical examination without abnormal findings: Secondary | ICD-10-CM | POA: Diagnosis not present

## 2017-12-04 DIAGNOSIS — F4321 Adjustment disorder with depressed mood: Secondary | ICD-10-CM | POA: Diagnosis not present

## 2018-01-02 DIAGNOSIS — I429 Cardiomyopathy, unspecified: Secondary | ICD-10-CM | POA: Diagnosis not present

## 2018-01-02 DIAGNOSIS — I4892 Unspecified atrial flutter: Secondary | ICD-10-CM | POA: Diagnosis not present

## 2018-01-07 DIAGNOSIS — I509 Heart failure, unspecified: Secondary | ICD-10-CM

## 2018-01-07 DIAGNOSIS — Z7901 Long term (current) use of anticoagulants: Secondary | ICD-10-CM | POA: Insufficient documentation

## 2018-01-07 HISTORY — DX: Heart failure, unspecified: I50.9

## 2018-01-07 NOTE — Progress Notes (Deleted)
Cardiology Office Note:    Date:  01/07/2018   ID:  Bryan Paul, DOB Sep 11, 1934, MRN [id number]  PCP:  Eulis Foster, FNP  Cardiologist:  Norman Herrlich, MD    Referring MD: Eulis Foster, FNP    ASSESSMENT:    1. Atrial flutter, unspecified type (HCC)   2. Chronic anticoagulation   3. Dilated cardiomyopathy (HCC)   4. Hypertensive heart disease with heart failure (HCC)    PLAN:    In order of problems listed above:  1. ***   Next appointment: ***   Medication Adjustments/Labs and Tests Ordered: Current medicines are reviewed at length with the patient today.  Concerns regarding medicines are outlined above.  No orders of the defined types were placed in this encounter.  No orders of the defined types were placed in this encounter.   No chief complaint on file.   History of Present Illness:    Bryan Paul is a 83 y.o. male with a hx of *** last seen by Dr Wille Glaser in [place] 10/31/16 Reason for Visit: F/u atrial flutter with RVR, maintaining NSR  Assessment & Plan: 1. PVC's (premature ventricular contractions) Basic Metabolic Panel  2. Paroxysmal atrial flutter (RAF-HCC) Event monitor after holter  Basic Metabolic Panel  TSH  3. Dilated cardiomyopathy (RAF-HCC) TSH  resolved on medications  4. Chronic fatigue CBC  TSH  CBC  5. Chronic anticoagulation CBC   1. Rhythm is stable, occas premature c/w PVCs 2. Mild cardiomyopathy has resolved on EBM and rate control 3. Chronic fatigue - etiol?  PLAN: Event monitor, consider withdrawal of anticoag if no atrial arrhythmias RTC Cardiology: 4 mos, estab f/u with Dr. Bing Matter  He was seen by [facility] cardiology 01/02/18: Assessment & Plan: 1. Atrial flutter, unspecified type (HCC) ECV RH 2017--EKG shows atrial flutter rate 80, we have discussed cardioversion and he wants to think about it. I will start Xarelto 20mg  daily. Will reevaluate in 4wks - EKG - PERFORMED BY CLINIC STAFF 2. Cardiomyopathy,  unspecified type (HCC) Hx of now resolved,mediated by tachycardia, stable recent nuclear shows EF 60%  Compliance with diet, lifestyle and medications: *** Past Medical History:  Diagnosis Date  . Adhesive capsulitis of shoulder 12/20/2009   Qualifier: Diagnosis of  By: Lovell Sheehan MD, Balinda Quails   . Anxiety 06/13/2011  . Atrial flutter (HCC) 03/06/2017  . CHF (congestive heart failure) (HCC) 01/07/2018  . CONDUCTIVE HEARING LOSS BILATERAL 06/16/2008   Qualifier: Diagnosis of  By: Lovell Sheehan MD, John E   . Dilated cardiomyopathy (HCC) 03/06/2017  . Fatigue 05/17/2017  . Hypertension   . HYPERTENSION 03/12/2007   Qualifier: Diagnosis of  By: Mayford Knife, LPN, Domenic Polite   . HYPERTROPHY PROSTATE W/UR OBST & OTH LUTS 04/28/2009   Qualifier: Diagnosis of  By: Lovell Sheehan MD, Balinda Quails ING HERN W/OBST W/O MENTION GANGREN UNILAT/UNS 10/31/2007   Qualifier: Diagnosis of  By: Lovell Sheehan MD, Balinda Quails   . Weight loss 06/13/2011    Past Surgical History:  Procedure Laterality Date  . colonscopy  09/07/2006    Current Medications: No outpatient medications have been marked as taking for the 01/08/18 encounter (Appointment) with Baldo Daub, MD.     Allergies:   Patient has no known allergies.   Social History   Socioeconomic History  . Marital status: Married    Spouse name: Not on file  . Number of children: Not on file  . Years of education: Not on file  . Highest  education level: Not on file  Occupational History  . Not on file  Social Needs  . Financial resource strain: Not on file  . Food insecurity:    Worry: Not on file    Inability: Not on file  . Transportation needs:    Medical: Not on file    Non-medical: Not on file  Tobacco Use  . Smoking status: Former Games developer  . Smokeless tobacco: Never Used  Substance and Sexual Activity  . Alcohol use: No  . Drug use: No  . Sexual activity: Not on file  Lifestyle  . Physical activity:    Days per week: Not on file    Minutes per session: Not on file    . Stress: Not on file  Relationships  . Social connections:    Talks on phone: Not on file    Gets together: Not on file    Attends religious service: Not on file    Active member of club or organization: Not on file    Attends meetings of clubs or organizations: Not on file    Relationship status: Not on file  Other Topics Concern  . Not on file  Social History Narrative  . Not on file     Family History: The patient's ***family history includes Aneurysm in his unknown relative. There is no history of CAD or Sudden Cardiac Death. ROS:   Please see the history of present illness.    All other systems reviewed and are negative.  EKGs/Labs/Other Studies Reviewed:    The following studies were reviewed today:  EKG:  EKG ordered today.  The ekg ordered today demonstrates ***  Recent Labs: No results found for requested labs within last 8760 hours.  Recent Lipid Panel    Component Value Date/Time   CHOL 165 04/21/2009 1023   TRIG 59.0 04/21/2009 1023   TRIG 63 06/04/2006 1215   HDL 46.10 04/21/2009 1023   CHOLHDL 4 04/21/2009 1023   VLDL 11.8 04/21/2009 1023   LDLCALC 107 (H) 04/21/2009 1023    Physical Exam:    VS:  There were no vitals taken for this visit.    Wt Readings from Last 3 Encounters:  03/23/14 149 lb (67.6 kg)  12/30/13 149 lb 8 oz (67.8 kg)  12/08/13 149 lb (67.6 kg)     GEN: *** Well nourished, well developed in no acute distress HEENT: Normal NECK: No JVD; No carotid bruits LYMPHATICS: No lymphadenopathy CARDIAC: ***RRR, no murmurs, rubs, gallops RESPIRATORY:  Clear to auscultation without rales, wheezing or rhonchi  ABDOMEN: Soft, non-tender, non-distended MUSCULOSKELETAL:  No edema; No deformity  SKIN: Warm and dry NEUROLOGIC:  Alert and oriented x 3 PSYCHIATRIC:  Normal affect    Signed, Norman Herrlich, MD  01/07/2018 1:53 PM    [facility]

## 2018-01-08 ENCOUNTER — Ambulatory Visit: Payer: PPO | Admitting: Cardiology

## 2018-01-15 NOTE — Progress Notes (Signed)
Cardiology Office Note:    Date:  01/16/2018   ID:  Bryan Paul, DOB [birth year], MRN [id number]  PCP:  Marylen Ponto, MD  Cardiologist:  Norman Herrlich, MD    Referring MD: Eulis Foster, FNP    ASSESSMENT:    1. Atrial flutter, unspecified type (HCC)   2. Hypertensive heart disease with heart failure (HCC)   3. Dilated cardiomyopathy (HCC)   4. Chronic anticoagulation    PLAN:    In order of problems listed above:  1. Stable rate is controlled indeterminate duration continue beta-blocker anticoagulant and reassess in 3 months.  If he is not doing well with the other evidence of left ventricular dysfunction or symptoms of exercise intolerance we will make arrangements for resuming sinus rhythm.   2. Stable hypertension controlled no evidence of heart failure Recent ejection fraction is normal 3. Stable ejection fraction is normalized continue current medical treatment 4. Continue anticoagulant discontinue aspirin   Next appointment: 3 months   Medication Adjustments/Labs and Tests Ordered: Current medicines are reviewed at length with the patient today.  Concerns regarding medicines are outlined above.  No orders of the defined types were placed in this encounter.  No orders of the defined types were placed in this encounter.   Chief Complaint  Patient presents with  . Follow-up  . Atrial Flutter  . Anticoagulation    History of Present Illness:    Bryan Paul is a 83 y.o. male with a hx of  1. PVC's  2. Paroxysmal atrial flutter  3. Dilated cardiomyopathy-resolved  4. Chronic fatigue  5. Chronic anticoagulation  He was  last seen by Dr Wille Glaser [facility] 10/31/16. From Epic care Everywhere: In 2017 he underwent successful electrical cardioversion at  [facility],. Follow-up monitoring demonstrated no return of atrial flutter he has not been maintained on antiarrhythmic therapy. He is now off of anticoagulation as well. He only remains on low-dose  beta blocker and ACE inhibitor. His left ventricular function normalized after correction of his underlying cardiac arrhythmia and his ejection fraction is now 60% by recent nuclear stress test.   His EKG 01/02/18 showed atrial flutter at 80 BPM.I cannot view the EKG in [facility]. He was started on Xarelto that day.  Compliance with diet, lifestyle and medications: yes  Recently was noted to be in rate controlled atypical atrial flutter.  His son is with him he has had no awareness no clinical change.  The pattern is 1 of atypical left bundle of the options include excepting the rhythm with rate control and anticoagulation or attempts at cardioversion with antiarrhythmic drug referral for EP procedures.  After discussion with the patient and the son he elects to continue rate control anticoagulation and reassess in the office in 3 months.  I note he is taking aspirin and anticoagulant he will stop his aspirin.  Recent evaluation shows normal left ventricular function. Past Medical History:  Diagnosis Date  . Adhesive capsulitis of shoulder 12/20/2009   Qualifier: Diagnosis of  By: Lovell Sheehan MD, Balinda Quails   . Anxiety 06/13/2011  . Atrial flutter (HCC) 03/06/2017  . CHF (congestive heart failure) (HCC) 01/07/2018  . CONDUCTIVE HEARING LOSS BILATERAL 06/16/2008   Qualifier: Diagnosis of  By: Lovell Sheehan MD, John E   . Dilated cardiomyopathy (HCC) 03/06/2017  . Fatigue 05/17/2017  . Hypertension   . HYPERTENSION 03/12/2007   Qualifier: Diagnosis of  By: Mayford Knife, LPN, Domenic Polite   . HYPERTROPHY PROSTATE W/UR OBST & OTH  LUTS 04/28/2009   Qualifier: Diagnosis of  By: Lovell Sheehan MD, Janeece Riggers HERN W/OBST W/O MENTION GANGREN UNILAT/UNS 10/31/2007   Qualifier: Diagnosis of  By: Lovell Sheehan MD, Balinda Quails   . Weight loss 06/13/2011    Past Surgical History:  Procedure Laterality Date  . colonscopy  09/07/2006    Current Medications: Current Meds  Medication Sig  . citalopram (CELEXA) 10 MG tablet Take 10 mg  by mouth daily.  Marland Kitchen doxazosin (CARDURA) 8 MG tablet TAKE 1 TABLET BY MOUTH EVERY NIGHT AT BEDTIME  . lisinopril (PRINIVIL,ZESTRIL) 2.5 MG tablet TK 1 T PO D  . LORazepam (ATIVAN) 0.5 MG tablet TAKE 1 TABLET BY MOUTH TWICE DAILY AS NEEDED  . metoprolol succinate (TOPROL-XL) 25 MG 24 hr tablet Take 25 mg by mouth daily.  . rivaroxaban (XARELTO) 20 MG TABS tablet Take 20 mg by mouth daily.  . [DISCONTINUED] aspirin 81 MG tablet Take 81 mg by mouth daily.       Allergies:   Patient has no known allergies.   Social History   Socioeconomic History  . Marital status: Married    Spouse name: Not on file  . Number of children: Not on file  . Years of education: Not on file  . Highest education level: Not on file  Occupational History  . Not on file  Social Needs  . Financial resource strain: Not on file  . Food insecurity:    Worry: Not on file    Inability: Not on file  . Transportation needs:    Medical: Not on file    Non-medical: Not on file  Tobacco Use  . Smoking status: Former Games developer  . Smokeless tobacco: Never Used  Substance and Sexual Activity  . Alcohol use: No  . Drug use: No  . Sexual activity: Not on file  Lifestyle  . Physical activity:    Days per week: Not on file    Minutes per session: Not on file  . Stress: Not on file  Relationships  . Social connections:    Talks on phone: Not on file    Gets together: Not on file    Attends religious service: Not on file    Active member of club or organization: Not on file    Attends meetings of clubs or organizations: Not on file    Relationship status: Not on file  Other Topics Concern  . Not on file  Social History Narrative  . Not on file     Family History: The patient's family history includes Aneurysm in his unknown relative; Anxiety disorder in his sister; Stroke in his father. There is no history of CAD or Sudden Cardiac Death. ROS:  Depression Please see the history of present illness.    All other  systems reviewed and are negative.  EKGs/Labs/Other Studies Reviewed:    The following studies were reviewed today:  EKG:  EKG ordered today.  The ekg ordered today demonstrates atypical atrial flutter controlled rate 1 PVC  Recent Labs: No results found for requested labs within last 8760 hours.  Recent Lipid Panel    Component Value Date/Time   CHOL 165 04/21/2009 1023   TRIG 59.0 04/21/2009 1023   TRIG 63 06/04/2006 1215   HDL 46.10 04/21/2009 1023   CHOLHDL 4 04/21/2009 1023   VLDL 11.8 04/21/2009 1023   LDLCALC 107 (H) 04/21/2009 1023    Physical Exam:    VS:  BP 96/68 (BP Location: Right Arm,  Patient Position: Sitting, Cuff Size: Normal)   Pulse 99   Ht 5\' 8"  (1.727 m)   Wt 162 lb (73.5 kg)   SpO2 98%   BMI 24.63 kg/m     Wt Readings from Last 3 Encounters:  01/16/18 162 lb (73.5 kg)  03/23/14 149 lb (67.6 kg)  12/30/13 149 lb 8 oz (67.8 kg)     GEN:  Well nourished, well developed in no acute distress HEENT: Normal NECK: No JVD; No carotid bruits LYMPHATICS: No lymphadenopathy CARDIAC: IrrIrr variable s1  RESPIRATORY:  Clear to auscultation without rales, wheezing or rhonchi  ABDOMEN: Soft, non-tender, non-distended MUSCULOSKELETAL:  No edema; No deformity  SKIN: Warm and dry NEUROLOGIC:  Alert and oriented x 3 PSYCHIATRIC:  Normal affect    Signed, Norman Herrlich, MD  01/16/2018 11:18 AM     [facility]

## 2018-01-16 ENCOUNTER — Encounter: Payer: Self-pay | Admitting: Cardiology

## 2018-01-16 ENCOUNTER — Ambulatory Visit (INDEPENDENT_AMBULATORY_CARE_PROVIDER_SITE_OTHER): Payer: PPO | Admitting: Cardiology

## 2018-01-16 VITALS — BP 96/68 | HR 99 | Ht 68.0 in | Wt 162.0 lb

## 2018-01-16 DIAGNOSIS — I4892 Unspecified atrial flutter: Secondary | ICD-10-CM

## 2018-01-16 DIAGNOSIS — I420 Dilated cardiomyopathy: Secondary | ICD-10-CM | POA: Diagnosis not present

## 2018-01-16 DIAGNOSIS — I110 Hypertensive heart disease with heart failure: Secondary | ICD-10-CM

## 2018-01-16 DIAGNOSIS — Z7901 Long term (current) use of anticoagulants: Secondary | ICD-10-CM

## 2018-01-16 NOTE — Patient Instructions (Addendum)
Medication Instructions:  Your physician has recommended you make the following change in your medication:  STOP aspirin  Labwork: None  Testing/Procedures: You had an EKG today.  Follow-Up: Your physician wants you to follow-up in: 3 months. You will receive a reminder letter in the mail two months in advance. If you don't receive a letter, please call our office to schedule the follow-up appointment.  Any Other Special Instructions Will Be Listed Below (If Applicable).     If you need a refill on your cardiac medications before your next appointment, please call your pharmacy.    1. Avoid all over-the-counter antihistamines except Claritin/Loratadine and Zyrtec/Cetrizine. 2. Avoid all combination including cold sinus allergies flu decongestant and sleep medications 3. You can use Robitussin DM Mucinex and Mucinex DM for cough. 4. can use Tylenol aspirin ibuprofen and naproxen but no combinations such as sleep or sinus.

## 2018-01-25 ENCOUNTER — Ambulatory Visit: Payer: PPO

## 2018-01-25 ENCOUNTER — Telehealth: Payer: Self-pay

## 2018-01-25 ENCOUNTER — Ambulatory Visit (INDEPENDENT_AMBULATORY_CARE_PROVIDER_SITE_OTHER): Payer: PPO | Admitting: Cardiology

## 2018-01-25 VITALS — BP 116/68 | HR 64

## 2018-01-25 DIAGNOSIS — I4892 Unspecified atrial flutter: Secondary | ICD-10-CM

## 2018-01-25 NOTE — Telephone Encounter (Signed)
Spoke with [name] per DPR. [name] states that yesterday afternoon the patient almost lost consciousness. His head dropped and he could not communicate, but he was awake. Family that was with the patient at the time was not sure how to use the blood pressure cuff or check a pulse. Patient came to quickly. [name] states she went to the patient's house after the episode and blood pressure was 170/100, heart rate 122. Patient went out to eat with her last night and seemed fine. Today [name] states blood pressure was 155/95, heart rate in the 60s-70s. [name] states these symptoms are similar to those he had a few years ago prior to having a cardioversion.  Discussed above with Dr. Tomie China who advised to bring the patient in today for a nurse visit for blood pressure, pulse, and EKG.  [name] verbalized understanding and appointment scheduled for 3 pm. No further questions.

## 2018-01-25 NOTE — Patient Instructions (Signed)
Medication Instructions:  Your physician recommends that you continue on your current medications as directed. Please refer to the Current Medication list given to you today.  Labwork: None ordered  Testing/Procedures: EKG today  Your physician has recommended that you wear a holter monitor. Holter monitors are medical devices that record the heart's electrical activity. Doctors most often use these monitors to diagnose arrhythmias. Arrhythmias are problems with the speed or rhythm of the heartbeat. The monitor is a small, portable device. You can wear one while you do your normal daily activities. This is usually used to diagnose what is causing palpitations/syncope (passing out).  Follow-Up: Your physician recommends that you schedule a follow-up appointment on Thursday with Dr. Tomie China.   Any Other Special Instructions Will Be Listed Below (If Applicable).     If you need a refill on your cardiac medications before your next appointment, please call your pharmacy.

## 2018-01-25 NOTE — Progress Notes (Signed)
Patient is in this afternoon for a nurse visit, after speaking with Michaela,RN this morning over the phone about an episode that he had yesterday. His pulse and blood pressure were taken as well as an EKG performed. The results were reviewed by Dr. Tomie China who discussed them with the patient and his son. Dr. Tomie China recommended that the patient have a 48 hour Holter monitor placed, which will be put on today, and follow up with Dr. Tomie China to discuss possible Cardioversion in 1 week. Patient's son expressed understanding with the current plan and all questions have been answered by myself and Dr. Tomie China.

## 2018-01-27 DIAGNOSIS — I4892 Unspecified atrial flutter: Secondary | ICD-10-CM | POA: Diagnosis not present

## 2018-01-31 ENCOUNTER — Ambulatory Visit (INDEPENDENT_AMBULATORY_CARE_PROVIDER_SITE_OTHER): Payer: PPO | Admitting: Cardiology

## 2018-01-31 ENCOUNTER — Encounter: Payer: Self-pay | Admitting: Cardiology

## 2018-01-31 VITALS — BP 110/68 | HR 96 | Ht 68.0 in | Wt 161.0 lb

## 2018-01-31 DIAGNOSIS — I4892 Unspecified atrial flutter: Secondary | ICD-10-CM | POA: Diagnosis not present

## 2018-01-31 MED ORDER — RIVAROXABAN 20 MG PO TABS: 20.0000 mg | ORAL_TABLET | Freq: Every day | ORAL | 1 refills | Status: DC

## 2018-01-31 NOTE — Addendum Note (Signed)
Addended by: Craige Cotta on: 01/31/2018 11:11 AM   Modules accepted: Orders

## 2018-01-31 NOTE — Patient Instructions (Addendum)
Medication Instructions:  Your physician recommends that you continue on your current medications as directed. Please refer to the Current Medication list given to you today.  Labwork: Your physician recommends that you have the following labs drawn: BMP and CBC  Testing/Procedures:  You are scheduled for a Cardioversionon on 07/02 with Dr. Margo Aye.  Please arrive at the outpatient center at [facility] at 7:30 am.  DIET: Nothing to eat or drink after midnight except a sip of water with medications (see medication instructions below)  Medication Instructions: Hold your lisinopril and metoprolol the morning of the cardioversion.  Continue your anticoagulant: Xarelto (this cannot be interrupted prior to the cardioversion) You will need to continue your anticoagulant after your procedure until you  are told by your  Provider that it is safe to stop   You must have a responsible person to drive you home and stay in the waiting area during your procedure. Failure to do so could result in cancellation.  Bring your insurance cards.  *Special Note: Every effort is made to have your procedure done on time. Occasionally there are emergencies that occur at the hospital that may cause delays. Please be patient if a delay does occur.    Follow-Up: Your physician recommends that you schedule a follow-up appointment in: 1 month  Any Other Special Instructions Will Be Listed Below (If Applicable).     If you need a refill on your cardiac medications before your next appointment, please call your pharmacy.   [facility]  Garey Ham, RN, BSN   Electrical Cardioversion Electrical cardioversion is the delivery of a jolt of electricity to restore a normal rhythm to the heart. A rhythm that is too fast or is not regular keeps the heart from pumping well. In this procedure, sticky patches or metal paddles are placed on the chest to deliver electricity to the heart from a device. This  procedure may be done in an emergency if:  There is low or no blood pressure as a result of the heart rhythm.  Normal rhythm must be restored as fast as possible to protect the brain and heart from further damage.  It may save a life.  This procedure may also be done for irregular or fast heart rhythms that are not immediately life-threatening. Tell a health care provider about:  Any allergies you have.  All medicines you are taking, including vitamins, herbs, eye drops, creams, and over-the-counter medicines.  Any problems you or family members have had with anesthetic medicines.  Any blood disorders you have.  Any surgeries you have had.  Any medical conditions you have.  Whether you are pregnant or may be pregnant. What are the risks? Generally, this is a safe procedure. However, problems may occur, including:  Allergic reactions to medicines.  A blood clot that breaks free and travels to other parts of your body.  The possible return of an abnormal heart rhythm within hours or days after the procedure.  Your heart stopping (cardiac arrest). This is rare.  What happens before the procedure? Medicines  Your health care provider may have you start taking: ? Blood-thinning medicines (anticoagulants) so your blood does not clot as easily. ? Medicines may be given to help stabilize your heart rate and rhythm.  Ask your health care provider about changing or stopping your regular medicines. This is especially important if you are taking diabetes medicines or blood thinners. General instructions  Plan to have someone take you home from the hospital  or clinic.  If you will be going home right after the procedure, plan to have someone with you for 24 hours.  Follow instructions from your health care provider about eating or drinking restrictions. What happens during the procedure?  To lower your risk of infection: ? Your health care team will wash or sanitize their  hands. ? Your skin will be washed with soap.  An IV tube will be inserted into one of your veins.  You will be given a medicine to help you relax (sedative).  Sticky patches (electrodes) or metal paddles may be placed on your chest.  An electrical shock will be delivered. The procedure may vary among health care providers and hospitals. What happens after the procedure?  Your blood pressure, heart rate, breathing rate, and blood oxygen level will be monitored until the medicines you were given have worn off.  Do not drive for 24 hours if you were given a sedative.  Your heart rhythm will be watched to make sure it does not change. This information is not intended to replace advice given to you by your health care provider. Make sure you discuss any questions you have with your health care provider. Document Released: 07/14/2002 Document Revised: 03/22/2016 Document Reviewed: 01/28/2016 Elsevier Interactive Patient Education  2017 [employer].

## 2018-01-31 NOTE — Progress Notes (Signed)
Cardiology Office Note:    Date:  01/31/2018   ID:  Bryan Paul, DOB 1934-10-03, MRN [id number]  PCP:  Marylen Ponto, MD  Cardiologist:  Garwin Brothers, MD   Referring MD: Marylen Ponto, MD    ASSESSMENT:    1. Atrial flutter, unspecified type (HCC)    PLAN:    In order of problems listed above:  1. I discussed my findings with the patient at extensive length.  Again next week will be a full month of anticoagulation and I think as long as he has over 3 weeks of uninterrupted anticoagulation he is fine to undergo cardioversion.  Benefits and potential risks explained to the patient and he vocalized understanding.  I told his son that he should not stop Xarelto even on the night previous to the cardioversion.  He will take it uninterrupted.  We will schedule this test next week.  On the morning of the test he will not take his metoprolol and lisinopril.  I am a little concerned about issues with bradycardia.  He will be seen in follow-up appointment in 1 month after cardioversion.  My partner will follow him.  He knows to go to the nearest emergency room for any concerning symptoms.   Medication Adjustments/Labs and Tests Ordered: Current medicines are reviewed at length with the patient today.  Concerns regarding medicines are outlined above.  Orders Placed This Encounter  Procedures  . ELECTRICAL CARDIOVERSION  . Basic metabolic panel   No orders of the defined types were placed in this encounter.    Chief Complaint  Patient presents with  . Follow-up    Follow up from nurse visit last week to discuss possible cardioversion     History of Present Illness:    Bryan Paul is a 83 y.o. male.  The patient follows Dr. Dulce Sellar my partner.  He has history of atrial flutter and cardioversion in the remote past.  It will be 1 month next week that he would have undergone consistent anticoagulation with Xarelto without any interruption.  His son accompanies him for  this visit.  He was seen in for a nurse's visit and I spoke to him and his son last week.  He gave symptoms of tachyarrhythmia.  His Holter monitoring is back and it shows an elevated heart rate even at baseline.  He is a poor historian.  He complains of palpitations on and off.  At the time of my evaluation, the patient is alert awake oriented and in no distress.  Past Medical History:  Diagnosis Date  . Adhesive capsulitis of shoulder 12/20/2009   Qualifier: Diagnosis of  By: Lovell Sheehan MD, Balinda Quails   . Anxiety 06/13/2011  . Atrial flutter (HCC) 03/06/2017  . CHF (congestive heart failure) (HCC) 01/07/2018  . CONDUCTIVE HEARING LOSS BILATERAL 06/16/2008   Qualifier: Diagnosis of  By: Lovell Sheehan MD, John E   . Dilated cardiomyopathy (HCC) 03/06/2017  . Fatigue 05/17/2017  . Hypertension   . HYPERTENSION 03/12/2007   Qualifier: Diagnosis of  By: Mayford Knife, LPN, Domenic Polite   . HYPERTROPHY PROSTATE W/UR OBST & OTH LUTS 04/28/2009   Qualifier: Diagnosis of  By: Lovell Sheehan MD, Balinda Quails ING HERN W/OBST W/O MENTION GANGREN UNILAT/UNS 10/31/2007   Qualifier: Diagnosis of  By: Lovell Sheehan MD, Balinda Quails   . Weight loss 06/13/2011    Past Surgical History:  Procedure Laterality Date  . colonscopy  09/07/2006    Current Medications: Current Meds  Medication Sig  . citalopram (CELEXA) 10 MG tablet Take 10 mg by mouth daily.  Marland Kitchen doxazosin (CARDURA) 8 MG tablet TAKE 1 TABLET BY MOUTH EVERY NIGHT AT BEDTIME  . lisinopril (PRINIVIL,ZESTRIL) 2.5 MG tablet TK 1 T PO D  . LORazepam (ATIVAN) 0.5 MG tablet TAKE 1 TABLET BY MOUTH TWICE DAILY AS NEEDED (Patient taking differently: TAKE 1 TABLET BY MOUTH AS NEEDED)  . metoprolol succinate (TOPROL-XL) 25 MG 24 hr tablet Take 25 mg by mouth daily.  . rivaroxaban (XARELTO) 20 MG TABS tablet Take 20 mg by mouth daily.     Allergies:   Patient has no known allergies.   Social History   Socioeconomic History  . Marital status: Married    Spouse name: Not on file  . Number of  children: Not on file  . Years of education: Not on file  . Highest education level: Not on file  Occupational History  . Not on file  Social Needs  . Financial resource strain: Not on file  . Food insecurity:    Worry: Not on file    Inability: Not on file  . Transportation needs:    Medical: Not on file    Non-medical: Not on file  Tobacco Use  . Smoking status: Former Games developer  . Smokeless tobacco: Never Used  Substance and Sexual Activity  . Alcohol use: No  . Drug use: No  . Sexual activity: Not on file  Lifestyle  . Physical activity:    Days per week: Not on file    Minutes per session: Not on file  . Stress: Not on file  Relationships  . Social connections:    Talks on phone: Not on file    Gets together: Not on file    Attends religious service: Not on file    Active member of club or organization: Not on file    Attends meetings of clubs or organizations: Not on file    Relationship status: Not on file  Other Topics Concern  . Not on file  Social History Narrative  . Not on file     Family History: The patient's family history includes Aneurysm in his unknown relative; Anxiety disorder in his sister; Stroke in his father. There is no history of CAD or Sudden Cardiac Death.  ROS:   Please see the history of present illness.    All other systems reviewed and are negative.  EKGs/Labs/Other Studies Reviewed:    The following studies were reviewed today: EKG reveals atrial flutter with well-controlled ventricular rate.   Recent Labs: No results found for requested labs within last 8760 hours.  Recent Lipid Panel    Component Value Date/Time   CHOL 165 04/21/2009 1023   TRIG 59.0 04/21/2009 1023   TRIG 63 06/04/2006 1215   HDL 46.10 04/21/2009 1023   CHOLHDL 4 04/21/2009 1023   VLDL 11.8 04/21/2009 1023   LDLCALC 107 (H) 04/21/2009 1023    Physical Exam:    VS:  BP 110/68   Pulse 96   Ht 5\' 8"  (1.727 m)   Wt 161 lb (73 kg)   SpO2 98%   BMI  24.48 kg/m     Wt Readings from Last 3 Encounters:  01/31/18 161 lb (73 kg)  01/16/18 162 lb (73.5 kg)  03/23/14 149 lb (67.6 kg)     GEN: Patient is in no acute distress HEENT: Normal NECK: No JVD; No carotid bruits LYMPHATICS: No lymphadenopathy CARDIAC: Hear sounds regular, 2/6  systolic murmur at the apex. RESPIRATORY:  Clear to auscultation without rales, wheezing or rhonchi  ABDOMEN: Soft, non-tender, non-distended MUSCULOSKELETAL:  No edema; No deformity  SKIN: Warm and dry NEUROLOGIC:  Alert and oriented x 3 PSYCHIATRIC:  Normal affect   Signed, Garwin Brothers, MD  01/31/2018 9:52 AM    [facility]

## 2018-02-01 ENCOUNTER — Other Ambulatory Visit: Payer: Self-pay

## 2018-02-01 ENCOUNTER — Telehealth: Payer: Self-pay

## 2018-02-01 LAB — BASIC METABOLIC PANEL
BUN/Creatinine Ratio: 22 (ref 10–24)
BUN: 19 mg/dL (ref 8–27)
CALCIUM: 9 mg/dL (ref 8.6–10.2)
CHLORIDE: 102 mmol/L (ref 96–106)
CO2: 24 mmol/L (ref 20–29)
Creatinine, Ser: 0.87 mg/dL (ref 0.76–1.27)
GFR calc non Af Amer: 81 mL/min/{1.73_m2} (ref >59)
GFR, EST AFRICAN AMERICAN: 94 mL/min/{1.73_m2} (ref >59)
GLUCOSE: 86 mg/dL (ref 65–99)
POTASSIUM: 5.2 mmol/L (ref 3.5–5.2)
Sodium: 139 mmol/L (ref 134–144)

## 2018-02-01 LAB — CBC WITH DIFFERENTIAL/PLATELET
BASOS ABS: 0 10*3/uL (ref 0.0–0.2)
Basos: 0 %
EOS (ABSOLUTE): 0.2 10*3/uL (ref 0.0–0.4)
Eos: 2 %
Hematocrit: 47.9 % (ref 37.5–51.0)
Hemoglobin: 16 g/dL (ref 13.0–17.7)
IMMATURE GRANS (ABS): 0 10*3/uL (ref 0.0–0.1)
Immature Granulocytes: 0 %
Lymphocytes Absolute: 1.6 10*3/uL (ref 0.7–3.1)
Lymphs: 23 %
MCH: 29.7 pg (ref 26.6–33.0)
MCHC: 33.4 g/dL (ref 31.5–35.7)
MCV: 89 fL (ref 79–97)
MONOCYTES: 8 %
Monocytes Absolute: 0.6 10*3/uL (ref 0.1–0.9)
NEUTROS ABS: 4.9 10*3/uL (ref 1.4–7.0)
Neutrophils: 67 %
PLATELETS: 209 10*3/uL (ref 150–450)
RBC: 5.38 x10E6/uL (ref 4.14–5.80)
RDW: 14.6 % (ref 12.3–15.4)
WBC: 7.3 10*3/uL (ref 3.4–10.8)

## 2018-02-01 NOTE — Addendum Note (Signed)
Addended by: Craige Cotta on: 02/01/2018 03:20 PM   Modules accepted: Orders

## 2018-02-01 NOTE — Telephone Encounter (Signed)
Called patient and notified him of his lab results/ ?

## 2018-02-04 DIAGNOSIS — Z01818 Encounter for other preprocedural examination: Secondary | ICD-10-CM | POA: Diagnosis not present

## 2018-02-04 DIAGNOSIS — I4892 Unspecified atrial flutter: Secondary | ICD-10-CM | POA: Diagnosis not present

## 2018-02-05 DIAGNOSIS — Z79899 Other long term (current) drug therapy: Secondary | ICD-10-CM | POA: Diagnosis not present

## 2018-02-05 DIAGNOSIS — I110 Hypertensive heart disease with heart failure: Secondary | ICD-10-CM | POA: Diagnosis not present

## 2018-02-05 DIAGNOSIS — I483 Typical atrial flutter: Secondary | ICD-10-CM | POA: Diagnosis not present

## 2018-02-05 DIAGNOSIS — I420 Dilated cardiomyopathy: Secondary | ICD-10-CM | POA: Diagnosis not present

## 2018-02-05 DIAGNOSIS — I509 Heart failure, unspecified: Secondary | ICD-10-CM | POA: Diagnosis not present

## 2018-02-05 DIAGNOSIS — I4892 Unspecified atrial flutter: Secondary | ICD-10-CM | POA: Diagnosis not present

## 2018-02-05 DIAGNOSIS — I4891 Unspecified atrial fibrillation: Secondary | ICD-10-CM | POA: Diagnosis not present

## 2018-02-05 DIAGNOSIS — Z7901 Long term (current) use of anticoagulants: Secondary | ICD-10-CM | POA: Diagnosis not present

## 2018-03-04 NOTE — Progress Notes (Signed)
Cardiology Office Note:    Date:  03/05/2018   ID:  Bryan Paul, DOB Jan 08, 1935, MRN [id number]  PCP:  Marylen Ponto, MD  Cardiologist:  Norman Herrlich, MD    Referring MD: Marylen Ponto, MD    ASSESSMENT:    1. Atrial flutter, unspecified type (HCC)   2. Weight loss   3. Hypertensive heart disease with heart failure (HCC)    PLAN:    In order of problems listed above:  1. Stable in sinus rhythm he is at high risk of recurrent flutter he will start low-dose amiodarone check baseline CMP and thyroid repeat EKG in 1 week for QT interval low-dose Celexa.  After 1 month he will discontinue anticoagulant and resume low-dose aspirin 2. Stable 3. Stable blood pressure target continue current treatment alpha-blocker ACE inhibitor 4. Discontinue anticoagulant 30 days after cardioversion transition aspirin   Next appointment: 3 months   Medication Adjustments/Labs and Tests Ordered: Current medicines are reviewed at length with the patient today.  Concerns regarding medicines are outlined above.  Orders Placed This Encounter  Procedures  . TSH  . Comprehensive Metabolic Panel (CMET)  . EKG 12-Lead   Meds ordered this encounter  Medications  . aspirin EC 81 MG tablet    Sig: Take 1 tablet (81 mg total) by mouth daily.  Marland Kitchen amiodarone (PACERONE) 200 MG tablet    Sig: Take 1 tablet (200 mg total) by mouth daily.    Dispense:  30 tablet    Refill:  3    Chief Complaint  Patient presents with  . Follow-up    1 month follow up  . Atrial Fibrillation  . Anticoagulation    History of Present Illness:    Bryan Paul is a 83 y.o. male with a hx of PVCs paroxysmal atrial flutter previous dilated cardiomyopathy and chronic anti-coagulation.  He had recurrent atrial flutter when seen by me and was referred for outpatient cardioversion and  last seen by me 01/16/18. Compliance with diet, lifestyle and medications: Yes  He feels improved his son checks his pulse  every day regular he has had no chest pain dyspnea palpitation or syncope.  Family very strongly wants him to stop aspirin 30 days after cardioversion.  He has had no bleeding complication Past Medical History:  Diagnosis Date  . Adhesive capsulitis of shoulder 12/20/2009   Qualifier: Diagnosis of  By: Lovell Sheehan MD, Balinda Quails   . Anxiety 06/13/2011  . Atrial flutter (HCC) 03/06/2017  . CHF (congestive heart failure) (HCC) 01/07/2018  . CONDUCTIVE HEARING LOSS BILATERAL 06/16/2008   Qualifier: Diagnosis of  By: Lovell Sheehan MD, John E   . Dilated cardiomyopathy (HCC) 03/06/2017  . Fatigue 05/17/2017  . Hypertension   . HYPERTENSION 03/12/2007   Qualifier: Diagnosis of  By: Mayford Knife, LPN, Domenic Polite   . HYPERTROPHY PROSTATE W/UR OBST & OTH LUTS 04/28/2009   Qualifier: Diagnosis of  By: Lovell Sheehan MD, Balinda Quails ING HERN W/OBST W/O MENTION GANGREN UNILAT/UNS 10/31/2007   Qualifier: Diagnosis of  By: Lovell Sheehan MD, Balinda Quails   . Weight loss 06/13/2011    Past Surgical History:  Procedure Laterality Date  . colonscopy  09/07/2006    Current Medications: Current Meds  Medication Sig  . citalopram (CELEXA) 10 MG tablet Take 10 mg by mouth daily.  Marland Kitchen doxazosin (CARDURA) 8 MG tablet TAKE 1 TABLET BY MOUTH EVERY NIGHT AT BEDTIME  . lisinopril (PRINIVIL,ZESTRIL) 2.5 MG tablet TK 1 T PO D  .  LORazepam (ATIVAN) 0.5 MG tablet TAKE 1 TABLET BY MOUTH TWICE DAILY AS NEEDED (Patient taking differently: TAKE 1 TABLET BY MOUTH AS NEEDED)  . metoprolol succinate (TOPROL-XL) 25 MG 24 hr tablet Take 25 mg by mouth daily.  . [DISCONTINUED] rivaroxaban (XARELTO) 20 MG TABS tablet Take 1 tablet (20 mg total) by mouth daily with supper.     Allergies:   Patient has no known allergies.   Social History   Socioeconomic History  . Marital status: Married    Spouse name: Not on file  . Number of children: Not on file  . Years of education: Not on file  . Highest education level: Not on file  Occupational History  . Not on file    Social Needs  . Financial resource strain: Not on file  . Food insecurity:    Worry: Not on file    Inability: Not on file  . Transportation needs:    Medical: Not on file    Non-medical: Not on file  Tobacco Use  . Smoking status: Former Games developer  . Smokeless tobacco: Never Used  Substance and Sexual Activity  . Alcohol use: No  . Drug use: No  . Sexual activity: Not on file  Lifestyle  . Physical activity:    Days per week: Not on file    Minutes per session: Not on file  . Stress: Not on file  Relationships  . Social connections:    Talks on phone: Not on file    Gets together: Not on file    Attends religious service: Not on file    Active member of club or organization: Not on file    Attends meetings of clubs or organizations: Not on file    Relationship status: Not on file  Other Topics Concern  . Not on file  Social History Narrative  . Not on file     Family History: The patient's family history includes Aneurysm in his unknown relative; Anxiety disorder in his sister; Stroke in his father. There is no history of CAD or Sudden Cardiac Death. ROS:   Please see the history of present illness.    All other systems reviewed and are negative.  EKGs/Labs/Other Studies Reviewed:    The following studies were reviewed today:  EKG:  EKG ordered today.  The ekg ordered today demonstrates sinus rhythm normal  Recent Labs: 01/31/2018: BUN 19; Creatinine, Ser 0.87; Hemoglobin 16.0; Platelets 209; Potassium 5.2; Sodium 139  Recent Lipid Panel    Component Value Date/Time   CHOL 165 04/21/2009 1023   TRIG 59.0 04/21/2009 1023   TRIG 63 06/04/2006 1215   HDL 46.10 04/21/2009 1023   CHOLHDL 4 04/21/2009 1023   VLDL 11.8 04/21/2009 1023   LDLCALC 107 (H) 04/21/2009 1023    Physical Exam:    VS:  BP 112/72 (BP Location: Left Arm, Patient Position: Sitting, Cuff Size: Normal)   Pulse 60   Wt 162 lb 12.8 oz (73.8 kg)   SpO2 97%   BMI 24.75 kg/m     Wt Readings  from Last 3 Encounters:  03/05/18 162 lb 12.8 oz (73.8 kg)  01/31/18 161 lb (73 kg)  01/16/18 162 lb (73.5 kg)     GEN:  Well nourished, well developed in no acute distress HEENT: Normal NECK: No JVD; No carotid bruits LYMPHATICS: No lymphadenopathy CARDIAC:RRR, no murmurs, rubs, gallops RESPIRATORY:  Clear to auscultation without rales, wheezing or rhonchi  ABDOMEN: Soft, non-tender, non-distended MUSCULOSKELETAL:  No edema;  No deformity  SKIN: Warm and dry NEUROLOGIC:  Alert and oriented x 3 PSYCHIATRIC:  Normal affect    Signed, Norman Herrlich, MD  03/05/2018 12:45 PM    [facility]

## 2018-03-05 ENCOUNTER — Ambulatory Visit (INDEPENDENT_AMBULATORY_CARE_PROVIDER_SITE_OTHER): Payer: PPO | Admitting: Cardiology

## 2018-03-05 ENCOUNTER — Encounter: Payer: Self-pay | Admitting: Cardiology

## 2018-03-05 VITALS — BP 112/72 | HR 60 | Wt 162.8 lb

## 2018-03-05 DIAGNOSIS — I110 Hypertensive heart disease with heart failure: Secondary | ICD-10-CM

## 2018-03-05 DIAGNOSIS — I4892 Unspecified atrial flutter: Secondary | ICD-10-CM

## 2018-03-05 DIAGNOSIS — Z7901 Long term (current) use of anticoagulants: Secondary | ICD-10-CM

## 2018-03-05 DIAGNOSIS — R634 Abnormal weight loss: Secondary | ICD-10-CM

## 2018-03-05 MED ORDER — ASPIRIN EC 81 MG PO TBEC: 81.0000 mg | DELAYED_RELEASE_TABLET | Freq: Every day | ORAL | Status: AC

## 2018-03-05 MED ORDER — AMIODARONE HCL 200 MG PO TABS: 200.0000 mg | ORAL_TABLET | Freq: Every day | ORAL | 3 refills | Status: DC

## 2018-03-05 NOTE — Patient Instructions (Addendum)
Medication Instructions:  Your physician has recommended you make the following change in your medication:   STOP: Xarelto on 03/11/18 START: Aspirin 81 mg daily on 03/11/18.  START: Amiodarone 200 mg daily.   Labwork:  Your physician recommends that you return for lab work today: TSH, and CMP  Testing/Procedures: You had an ekg today.  Follow-Up: Your physician wants you to follow-up in: 1 week for repeat ekg. You will receive a reminder letter in the mail two months in advance. If you don't receive a letter, please call our office to schedule the follow-up appointment.   Any Other Special Instructions Will Be Listed Below (If Applicable).     If you need a refill on your cardiac medications before your next appointment, please call your pharmacy.   Aspirin and Your Heart Aspirin is a medicine that affects the way blood clots. Aspirin can be used to help reduce the risk of blood clots, heart attacks, and other heart-related problems. Should I take aspirin? Your health care provider will help you determine whether it is safe and beneficial for you to take aspirin daily. Taking aspirin daily may be beneficial if you:  Have had a heart attack or chest pain.  Have undergone open heart surgery such as coronary artery bypass surgery (CABG).  Have had coronary angioplasty.  Have experienced a stroke or transient ischemic attack (TIA).  Have peripheral vascular disease (PVD).  Have chronic heart rhythm problems such as atrial fibrillation.  Are there any risks of taking aspirin daily? Daily use of aspirin can increase your risk of side effects. Some of these include:  Bleeding. Bleeding problems can be minor or serious. An example of a minor problem is a cut that does not stop bleeding. An example of a more serious problem is stomach bleeding or bleeding into the brain. Your risk of bleeding is increased if you are also taking non-steroidal anti-inflammatory medicine  (NSAIDs).  Increased bruising.  Upset stomach.  An allergic reaction. People who have nasal polyps have an increased risk of developing an aspirin allergy.  What are some guidelines I should follow when taking aspirin?  Take aspirin only as directed by your health care provider. Make sure you understand how much you should take and what form you should take. The two forms of aspirin are: ? Non-enteric-coated. This type of aspirin does not have a coating and is absorbed quickly. Non-enteric-coated aspirin is usually recommended for people with chest pain. This type of aspirin also comes in a chewable form. ? Enteric-coated. This type of aspirin has a special coating that releases the medicine very slowly. Enteric-coated aspirin causes less stomach upset than non-enteric-coated aspirin. This type of aspirin should not be chewed or crushed.  Drink alcohol in moderation. Drinking alcohol increases your risk of bleeding. When should I seek medical care?  You have unusual bleeding or bruising.  You have stomach pain.  You have an allergic reaction. Symptoms of an allergic reaction include: ? Hives. ? Itchy skin. ? Swelling of the lips, tongue, or face.  You have ringing in your ears. When should I seek immediate medical care?  Your bowel movements are bloody, dark red, or black in color.  You vomit or cough up blood.  You have blood in your urine.  You cough, wheeze, or feel short of breath. If you have any of the following symptoms, this is an emergency. Do not wait to see if the pain will go away. Get medical help at once. Call  your local emergency services (911 in the [place] Do not drive yourself to the hospital.  You have severe chest pain, especially if the pain is crushing or pressure-like and spreads to the arms, back, neck, or jaw.  You have stroke-like symptoms, such as: ? Loss of vision. ? Difficulty talking. ? Numbness or weakness on one side of your body. ? Numbness  or weakness in your arm or leg. ? Not thinking clearly or feeling confused.  This information is not intended to replace advice given to you by your health care provider. Make sure you discuss any questions you have with your health care provider. Document Released: 07/06/2008 Document Revised: 12/01/2015 Document Reviewed: 10/29/2013 Elsevier Interactive Patient Education  2018 [employer].  Amiodarone tablets What is this medicine? AMIODARONE (a MEE oh da rone) is an antiarrhythmic drug. It helps make your heart beat regularly. Because of the side effects caused by this medicine, it is only used when other medicines have not worked. It is usually used for heartbeat problems that may be life threatening. This medicine may be used for other purposes; ask your health care provider or pharmacist if you have questions. COMMON BRAND NAME(S): Cordarone, Pacerone What should I tell my health care provider before I take this medicine? They need to know if you have any of these conditions: -liver disease -lung disease -other heart problems -thyroid disease -an unusual or allergic reaction to amiodarone, iodine, other medicines, foods, dyes, or preservatives -pregnant or trying to get pregnant -breast-feeding How should I use this medicine? Take this medicine by mouth with a glass of water. Follow the directions on the prescription label. You can take this medicine with or without food. However, you should always take it the same way each time. Take your doses at regular intervals. Do not take your medicine more often than directed. Do not stop taking except on the advice of your doctor or health care professional. A special MedGuide will be given to you by the pharmacist with each prescription and refill. Be sure to read this information carefully each time. Talk to your pediatrician regarding the use of this medicine in children. Special care may be needed. Overdosage: If you think you have taken  too much of this medicine contact a poison control center or emergency room at once. NOTE: This medicine is only for you. Do not share this medicine with others. What if I miss a dose? If you miss a dose, take it as soon as you can. If it is almost time for your next dose, take only that dose. Do not take double or extra doses. What may interact with this medicine? Do not take this medicine with any of the following medications: -abarelix -apomorphine -arsenic trioxide -certain antibiotics like erythromycin, gemifloxacin, levofloxacin, pentamidine -certain medicines for depression like amoxapine, tricyclic antidepressants -certain medicines for fungal infections like fluconazole, itraconazole, ketoconazole, posaconazole, voriconazole -certain medicines for irregular heart beat like disopyramide, dofetilide, dronedarone, ibutilide, propafenone, sotalol -certain medicines for malaria like chloroquine, halofantrine -cisapride -droperidol -haloperidol -hawthorn -maprotiline -methadone -phenothiazines like chlorpromazine, mesoridazine, thioridazine -pimozide -ranolazine -red yeast rice -vardenafil -ziprasidone This medicine may also interact with the following medications: -antiviral medicines for HIV or AIDS -certain medicines for blood pressure, heart disease, irregular heart beat -certain medicines for cholesterol like atorvastatin, cerivastatin, lovastatin, simvastatin -certain medicines for hepatitis C like sofosbuvir and ledipasvir; sofosbuvir -certain medicines for seizures like phenytoin -certain medicines for thyroid problems -certain medicines that treat or prevent blood clots like warfarin -cholestyramine -  cimetidine -clopidogrel -cyclosporine -dextromethorphan -diuretics -fentanyl -general anesthetics -grapefruit juice -lidocaine -loratadine -methotrexate -other medicines that prolong the QT interval (cause an abnormal heart  rhythm) -procainamide -quinidine -rifabutin, rifampin, or rifapentine -St. John's Wort -trazodone This list may not describe all possible interactions. Give your health care provider a list of all the medicines, herbs, non-prescription drugs, or dietary supplements you use. Also tell them if you smoke, drink alcohol, or use illegal drugs. Some items may interact with your medicine. What should I watch for while using this medicine? Your condition will be monitored closely when you first begin therapy. Often, this drug is first started in a hospital or other monitored health care setting. Once you are on maintenance therapy, visit your doctor or health care professional for regular checks on your progress. Because your condition and use of this medicine carry some risk, it is a good idea to carry an identification card, necklace or bracelet with details of your condition, medications, and doctor or health care professional. Bonita Quin may get drowsy or dizzy. Do not drive, use machinery, or do anything that needs mental alertness until you know how this medicine affects you. Do not stand or sit up quickly, especially if you are an older patient. This reduces the risk of dizzy or fainting spells. This medicine can make you more sensitive to the sun. Keep out of the sun. If you cannot avoid being in the sun, wear protective clothing and use sunscreen. Do not use sun lamps or tanning beds/booths. You should have regular eye exams before and during treatment. Call your doctor if you have blurred vision, see halos, or your eyes become sensitive to light. Your eyes may get dry. It may be helpful to use a lubricating eye solution or artificial tears solution. If you are going to have surgery or a procedure that requires contrast dyes, tell your doctor or health care professional that you are taking this medicine. What side effects may I notice from receiving this medicine? Side effects that you should report to your  doctor or health care professional as soon as possible: -allergic reactions like skin rash, itching or hives, swelling of the face, lips, or tongue -blue-gray coloring of the skin -blurred vision, seeing blue green halos, increased sensitivity of the eyes to light -breathing problems -chest pain -dark urine -fast, irregular heartbeat -feeling faint or light-headed -intolerance to heat or cold -nausea or vomiting -pain and swelling of the scrotum -pain, tingling, numbness in feet, hands -redness, blistering, peeling or loosening of the skin, including inside the mouth -spitting up blood -stomach pain -sweating -unusual or uncontrolled movements of body -unusually weak or tired -weight gain or loss -yellowing of the eyes or skin Side effects that usually do not require medical attention (report to your doctor or health care professional if they continue or are bothersome): -change in sex drive or performance -constipation -dizziness -headache -loss of appetite -trouble sleeping This list may not describe all possible side effects. Call your doctor for medical advice about side effects. You may report side effects to FDA at 1-800-FDA-1088. Where should I keep my medicine? Keep out of the reach of children. Store at room temperature between 20 and 25 degrees C (68 and 77 degrees F). Protect from light. Keep container tightly closed. Throw away any unused medicine after the expiration date. NOTE: This sheet is a summary. It may not cover all possible information. If you have questions about this medicine, talk to your doctor, pharmacist, or health care provider.  2018 Elsevier/Gold Standard (2013-10-27 19:48:11)

## 2018-03-06 LAB — COMPREHENSIVE METABOLIC PANEL
ALT: 13 IU/L (ref 0–44)
AST: 17 IU/L (ref 0–40)
Albumin/Globulin Ratio: 2.2 (ref 1.2–2.2)
Albumin: 4.4 g/dL (ref 3.5–4.7)
Alkaline Phosphatase: 65 IU/L (ref 39–117)
BUN/Creatinine Ratio: 26 — ABNORMAL HIGH (ref 10–24)
BUN: 23 mg/dL (ref 8–27)
Bilirubin Total: 0.6 mg/dL (ref 0.0–1.2)
CALCIUM: 9 mg/dL (ref 8.6–10.2)
CO2: 25 mmol/L (ref 20–29)
Chloride: 99 mmol/L (ref 96–106)
Creatinine, Ser: 0.89 mg/dL (ref 0.76–1.27)
GFR calc Af Amer: 93 mL/min/{1.73_m2} (ref >59)
GFR calc non Af Amer: 80 mL/min/{1.73_m2} (ref >59)
GLOBULIN, TOTAL: 2 g/dL (ref 1.5–4.5)
Glucose: 77 mg/dL (ref 65–99)
Potassium: 4.8 mmol/L (ref 3.5–5.2)
SODIUM: 137 mmol/L (ref 134–144)
Total Protein: 6.4 g/dL (ref 6.0–8.5)

## 2018-03-06 LAB — TSH: TSH: 0.592 u[IU]/mL (ref 0.450–4.500)

## 2018-03-12 ENCOUNTER — Ambulatory Visit (INDEPENDENT_AMBULATORY_CARE_PROVIDER_SITE_OTHER): Payer: PPO | Admitting: Cardiology

## 2018-03-12 DIAGNOSIS — I4892 Unspecified atrial flutter: Secondary | ICD-10-CM | POA: Diagnosis not present

## 2018-03-12 NOTE — Patient Instructions (Signed)
Medication Instructions:  Your physician recommends that you continue on your current medications as directed. Please refer to the Current Medication list given to you today.   Labwork: None  Testing/Procedures: None  Follow-Up: Your physician recommends that you schedule a follow-up appointment in: keep current appointment.   Any Other Special Instructions Will Be Listed Below (If Applicable).     If you need a refill on your cardiac medications before your next appointment, please call your pharmacy.   

## 2018-03-12 NOTE — Progress Notes (Signed)
Patient presented today for EKG due to amiodarone therapy, per Dr. Bing Matter would like for patient to keep current therapy and keep current follow up appointment

## 2018-06-11 DIAGNOSIS — Z6828 Body mass index (BMI) 28.0-28.9, adult: Secondary | ICD-10-CM | POA: Diagnosis not present

## 2018-06-11 DIAGNOSIS — H6090 Unspecified otitis externa, unspecified ear: Secondary | ICD-10-CM | POA: Diagnosis not present

## 2018-07-03 ENCOUNTER — Other Ambulatory Visit: Payer: Self-pay | Admitting: *Deleted

## 2018-07-03 MED ORDER — AMIODARONE HCL 200 MG PO TABS: 200.0000 mg | ORAL_TABLET | Freq: Every day | ORAL | 0 refills | Status: DC

## 2018-07-03 NOTE — Telephone Encounter (Signed)
30 day supply of amiodarone sent to [facility] in [place]. Patient is overdue for follow up and will have to schedule an appointment before further refills will be provided.

## 2018-08-09 ENCOUNTER — Telehealth: Payer: Self-pay | Admitting: Cardiology

## 2018-08-09 ENCOUNTER — Other Ambulatory Visit: Payer: Self-pay | Admitting: Emergency Medicine

## 2018-08-09 MED ORDER — AMIODARONE HCL 200 MG PO TABS: 200.0000 mg | ORAL_TABLET | Freq: Every day | ORAL | 3 refills | Status: DC

## 2018-08-09 NOTE — Telephone Encounter (Signed)
Mayme Genta, RN sent prescription as requested.

## 2018-08-09 NOTE — Telephone Encounter (Signed)
Call amiodarone to [facility] in [place]

## 2018-11-20 DIAGNOSIS — N401 Enlarged prostate with lower urinary tract symptoms: Secondary | ICD-10-CM | POA: Diagnosis not present

## 2018-11-20 DIAGNOSIS — Z79899 Other long term (current) drug therapy: Secondary | ICD-10-CM | POA: Diagnosis not present

## 2018-11-20 DIAGNOSIS — R351 Nocturia: Secondary | ICD-10-CM | POA: Diagnosis not present

## 2018-11-20 DIAGNOSIS — I482 Chronic atrial fibrillation, unspecified: Secondary | ICD-10-CM | POA: Diagnosis not present

## 2018-11-20 DIAGNOSIS — Z6828 Body mass index (BMI) 28.0-28.9, adult: Secondary | ICD-10-CM | POA: Diagnosis not present

## 2018-11-20 DIAGNOSIS — R413 Other amnesia: Secondary | ICD-10-CM | POA: Diagnosis not present

## 2018-11-20 DIAGNOSIS — R269 Unspecified abnormalities of gait and mobility: Secondary | ICD-10-CM | POA: Diagnosis not present

## 2018-11-20 DIAGNOSIS — I10 Essential (primary) hypertension: Secondary | ICD-10-CM | POA: Diagnosis not present

## 2018-11-23 DIAGNOSIS — F419 Anxiety disorder, unspecified: Secondary | ICD-10-CM | POA: Diagnosis not present

## 2018-11-23 DIAGNOSIS — R413 Other amnesia: Secondary | ICD-10-CM | POA: Diagnosis not present

## 2018-11-23 DIAGNOSIS — I10 Essential (primary) hypertension: Secondary | ICD-10-CM | POA: Diagnosis not present

## 2018-11-23 DIAGNOSIS — I482 Chronic atrial fibrillation, unspecified: Secondary | ICD-10-CM | POA: Diagnosis not present

## 2018-11-23 DIAGNOSIS — Z7982 Long term (current) use of aspirin: Secondary | ICD-10-CM | POA: Diagnosis not present

## 2018-11-23 DIAGNOSIS — R296 Repeated falls: Secondary | ICD-10-CM | POA: Diagnosis not present

## 2018-11-23 DIAGNOSIS — Z9181 History of falling: Secondary | ICD-10-CM | POA: Diagnosis not present

## 2018-11-23 DIAGNOSIS — N401 Enlarged prostate with lower urinary tract symptoms: Secondary | ICD-10-CM | POA: Diagnosis not present

## 2018-11-23 DIAGNOSIS — R351 Nocturia: Secondary | ICD-10-CM | POA: Diagnosis not present

## 2018-11-28 DIAGNOSIS — R296 Repeated falls: Secondary | ICD-10-CM | POA: Diagnosis not present

## 2018-11-28 DIAGNOSIS — I482 Chronic atrial fibrillation, unspecified: Secondary | ICD-10-CM | POA: Diagnosis not present

## 2018-12-09 ENCOUNTER — Other Ambulatory Visit: Payer: Self-pay | Admitting: Cardiology

## 2018-12-09 MED ORDER — AMIODARONE HCL 200 MG PO TABS: 200.0000 mg | ORAL_TABLET | Freq: Every day | ORAL | 0 refills | Status: DC

## 2018-12-09 NOTE — Telephone Encounter (Signed)
°*  STAT* If patient is at the pharmacy, call can be transferred to refill team.   1. Which medications need to be refilled? (please list name of each medication and dose if known) amiodarone (PACERONE) 200 MG tablet  2. Which pharmacy/location (including street and city if local pharmacy) is medication to be sent to?  [facility] [phone] - [place], [place] - [address] AT [facility] [phone] (Phone) [phone] (Fax)    3. Do they need a 30 day or 90 day supply? 90 day

## 2018-12-11 ENCOUNTER — Telehealth: Payer: Self-pay | Admitting: *Deleted

## 2018-12-11 NOTE — Telephone Encounter (Signed)
Recommend discontinuing Cardura if he is currently taking. This can cause dizziness.   Only the one episode? Let us know if episode repeats. Make sure he is eating/drinking prior to PT sessions. Review orthostatic hypotension precautions (change position slowly, when sitting to standing be sure to stand a minute before walking, etc.)  Please check BP daily for a week and call office with results.

## 2018-12-11 NOTE — Telephone Encounter (Signed)
Telephone call back to patient  son [name]. Informed to stop Cardura and check blood pressures daily for a week and call them into the office. Instructed to be cautious when changing positions from lying to sitting and standing. To take a few moments in between. Eat before PT sessions. [name] verbalized understanding.

## 2018-12-11 NOTE — Telephone Encounter (Signed)
Initially contacted by Terressa Koyanagi, PT with [facility]. Reporting initial blood pressure was 158/90. During therapy patient became slightly disoriented and unable to follow commands. Blood pressures were checked and  Were 88/60 and sitting 80/50.  I called patient and son answered [name]). [name] states he had given the patient his cardiac medications lisinopril 2.5 mg, metoprolol XL 50 mg and amiodarone 200 mg 30 minutes prior to PT working with him. After 5-10 minutes BP came up to 120/60. Pt at the time was back to normal orientation and able to follow commands.  Please review and advise of further instructions.

## 2018-12-23 DIAGNOSIS — N401 Enlarged prostate with lower urinary tract symptoms: Secondary | ICD-10-CM | POA: Diagnosis not present

## 2018-12-23 DIAGNOSIS — R413 Other amnesia: Secondary | ICD-10-CM | POA: Diagnosis not present

## 2018-12-23 DIAGNOSIS — Z9181 History of falling: Secondary | ICD-10-CM | POA: Diagnosis not present

## 2018-12-23 DIAGNOSIS — Z7982 Long term (current) use of aspirin: Secondary | ICD-10-CM | POA: Diagnosis not present

## 2018-12-23 DIAGNOSIS — F419 Anxiety disorder, unspecified: Secondary | ICD-10-CM | POA: Diagnosis not present

## 2018-12-23 DIAGNOSIS — I10 Essential (primary) hypertension: Secondary | ICD-10-CM | POA: Diagnosis not present

## 2018-12-23 DIAGNOSIS — R351 Nocturia: Secondary | ICD-10-CM | POA: Diagnosis not present

## 2018-12-23 DIAGNOSIS — R296 Repeated falls: Secondary | ICD-10-CM | POA: Diagnosis not present

## 2018-12-23 DIAGNOSIS — I482 Chronic atrial fibrillation, unspecified: Secondary | ICD-10-CM | POA: Diagnosis not present

## 2018-12-26 NOTE — Telephone Encounter (Signed)
Son called back about pt. BP has been running 144/75 to 80, HR 59 -66. 1 wk ago BP dropped while exercising and was 60/40, once he was seated for a few minutes it was 118/64. Basically pt is weak and sleeps a lot and lightheaded when he first stands up. Son says he cannot tell any difference since he went off of the digoxin.

## 2018-12-27 NOTE — Telephone Encounter (Signed)
Left message for patient/son to call back to make appointment next week with Dr. Dulce Sellar

## 2018-12-27 NOTE — Telephone Encounter (Signed)
Needs a face-to-face office visit next week

## 2019-01-02 NOTE — Progress Notes (Signed)
Cardiology Office Note:    Date:  01/03/2019   ID:  Bryan Paul, DOB 30-Oct-1934, MRN [id number]  PCP:  Marylen Ponto, MD  Cardiologist:  Norman Herrlich, MD    Referring MD: Marylen Ponto, MD    ASSESSMENT:    1. Atrial flutter, unspecified type (HCC)   2. Chronic anticoagulation   3. On amiodarone therapy   4. Hypertensive heart disease with heart failure (HCC)   5. Dilated cardiomyopathy (HCC)   6. Dizzy    PLAN:    In order of problems listed above:  1. Atrial flutter stable in sinus rhythm for now continue his low-dose amiodarone check TSH at risk for hypothyroidism that could account for much of his symptomatology if abnormal would need thyroid supplements 2. Fortunate is no longer anticoagulated we discontinued 30 days after his cardioversion continue low-dose aspirin 3. On amiodarone therapy stable maintaining sinus rhythm he had recent labs performed no evidence of liver toxicity but will run to go ahead and check a TSH before he leaves my office today 4. Hypertensive heart disease stable he had one episode of hypotension. 5. Dilated cardiomyopathy check echocardiogram proBNP level to see if severe left ventricular dysfunction is the cause of his deterioration 6. Dizzy episodes probably better described as gait dysfunction is being addressed he has physical therapy and is using a walker and close attention from his family   Next appointment: 4 weeks virtual   Medication Adjustments/Labs and Tests Ordered: Current medicines are reviewed at length with the patient today.  Concerns regarding medicines are outlined above.  Orders Placed This Encounter  Procedures  . Pro b natriuretic peptide (BNP)  . TSH+T4F+T3Free  . EKG 12-Lead   No orders of the defined types were placed in this encounter.   Chief Complaint  Patient presents with  . Follow-up  . Atrial Flutter  . Hypertension  . Dizziness    History of Present Illness:    Bryan Paul is a  84 y.o. male with a hx of PVCs, paroxysmal atrialf lutter, previous dilated cardiomyopathy, and chronic anti-coagulation. He was last seen by me 03/05/18 and started on amiodarone to prevent recurrent flutter after cardioversion 01/16/18. He was scheduled for this visit following a telephone encounter 12/11/18 involving hypotension, dizziness, and fatigue.   He is not doing well he is increasingly weak fatigued having difficulty ambulating uses a walker and with physical therapy 1 day he had an episode of near syncope and blood pressure fell to 60 systolic transiently.  Otherwise his blood pressures have been in range he complains of exertional shortness of breath but has had no edema chest pain or syncope.  He has a background history of sick sinus syndrome paroxysmal atrial flutter and amiodarone is at risk of bradycardia arrhythmia and will undergo a 7-day ZIO monitor.  His background history of cardiomyopathy echocardiogram is ordered and a proBNP level.  He takes amiodarone is at risk for hypothyroidism that can cause weakness and myopathy we will check a TSH today.  I will plan to see him in virtual follow-up in 4 weeks after the above testing is performed.  He had a fall about 6 weeks ago and struck his head did not have loss of consciousness and fortunately is presently not anticoagulated.  In general he is just progressively deteriorated.  Fortunately has an incredibly supportive and attentive family caring for him  Compliance with diet, lifestyle and medications: Yes his medications are supervised  Past Medical History:  Diagnosis Date  . Adhesive capsulitis of shoulder 12/20/2009   Qualifier: Diagnosis of  By: Lovell Sheehan MD, Balinda Quails   . Anxiety 06/13/2011  . Atrial flutter (HCC) 03/06/2017  . CHF (congestive heart failure) (HCC) 01/07/2018  . CONDUCTIVE HEARING LOSS BILATERAL 06/16/2008   Qualifier: Diagnosis of  By: Lovell Sheehan MD, John E   . Dilated cardiomyopathy (HCC) 03/06/2017  . Fatigue 05/17/2017   . Hypertension   . HYPERTENSION 03/12/2007   Qualifier: Diagnosis of  By: Mayford Knife, LPN, Domenic Polite   . HYPERTROPHY PROSTATE W/UR OBST & OTH LUTS 04/28/2009   Qualifier: Diagnosis of  By: Lovell Sheehan MD, Balinda Quails ING HERN W/OBST W/O MENTION GANGREN UNILAT/UNS 10/31/2007   Qualifier: Diagnosis of  By: Lovell Sheehan MD, Balinda Quails   . Weight loss 06/13/2011    Past Surgical History:  Procedure Laterality Date  . colonscopy  09/07/2006    Current Medications: Current Meds  Medication Sig  . amiodarone (PACERONE) 200 MG tablet Take 1 tablet (200 mg total) by mouth daily.  Marland Kitchen aspirin EC 81 MG tablet Take 1 tablet (81 mg total) by mouth daily.  . citalopram (CELEXA) 10 MG tablet Take 10 mg by mouth daily.  Marland Kitchen lisinopril (PRINIVIL,ZESTRIL) 2.5 MG tablet TK 1 T PO D  . LORazepam (ATIVAN) 0.5 MG tablet TAKE 1 TABLET BY MOUTH TWICE DAILY AS NEEDED (Patient taking differently: TAKE 1 TABLET BY MOUTH AS NEEDED)  . metoprolol succinate (TOPROL-XL) 25 MG 24 hr tablet Take 25 mg by mouth daily.     Allergies:   Patient has no known allergies.   Social History   Socioeconomic History  . Marital status: Married    Spouse name: Not on file  . Number of children: Not on file  . Years of education: Not on file  . Highest education level: Not on file  Occupational History  . Not on file  Social Needs  . Financial resource strain: Not on file  . Food insecurity:    Worry: Not on file    Inability: Not on file  . Transportation needs:    Medical: Not on file    Non-medical: Not on file  Tobacco Use  . Smoking status: Former Smoker    Types: Cigarettes  . Smokeless tobacco: Never Used  Substance and Sexual Activity  . Alcohol use: No  . Drug use: No  . Sexual activity: Not on file  Lifestyle  . Physical activity:    Days per week: Not on file    Minutes per session: Not on file  . Stress: Not on file  Relationships  . Social connections:    Talks on phone: Not on file    Gets together: Not on file     Attends religious service: Not on file    Active member of club or organization: Not on file    Attends meetings of clubs or organizations: Not on file    Relationship status: Not on file  Other Topics Concern  . Not on file  Social History Narrative  . Not on file     Family History: The patient's family history includes Aneurysm in an other family member; Anxiety disorder in his sister; Stroke in his father. There is no history of CAD or Sudden Cardiac Death. ROS:   Please see the history of present illness.    All other systems reviewed and are negative.  EKGs/Labs/Other Studies Reviewed:    The following studies were reviewed today:  EKG:  EKG ordered today and personally reviewed.  The ekg ordered today demonstrates him without conduction delay  Recent Labs:  11/20/2018: CMP showed a creatinine 1.5 potassium 4.2 estimated GFR 45 cc/min Hemoglobin 14.7 Cholesterol 141 HDL 30 LDL 87 Hemoglobin 14.7  01/31/2018: Hemoglobin 16.0; Platelets 209 03/05/2018: ALT 13; BUN 23; Creatinine, Ser 0.89; Potassium 4.8; Sodium 137; TSH 0.592  Recent Lipid Panel    Component Value Date/Time   CHOL 165 04/21/2009 1023   TRIG 59.0 04/21/2009 1023   TRIG 63 06/04/2006 1215   HDL 46.10 04/21/2009 1023   CHOLHDL 4 04/21/2009 1023   VLDL 11.8 04/21/2009 1023   LDLCALC 107 (H) 04/21/2009 1023    Physical Exam:    VS:  BP 124/80 (BP Location: Right Arm, Patient Position: Sitting, Cuff Size: Normal)   Pulse 76   Temp (!) 97.3 F (36.3 C)   Wt 158 lb 1.3 oz (71.7 kg)   SpO2 98%   BMI 24.04 kg/m     Wt Readings from Last 3 Encounters:  01/03/19 158 lb 1.3 oz (71.7 kg)  03/05/18 162 lb 12.8 oz (73.8 kg)  01/31/18 161 lb (73 kg)     GEN: He appears elderly and frail  in no acute distress HEENT: Normal NECK: No JVD; No carotid bruits LYMPHATICS: No lymphadenopathy CARDIAC: RRR, no murmurs, rubs, gallops RESPIRATORY:  Clear to auscultation without rales, wheezing or rhonchi   ABDOMEN: Soft, non-tender, non-distended MUSCULOSKELETAL:  No edema; No deformity  SKIN: Warm and dry NEUROLOGIC:  Alert and oriented x 3 PSYCHIATRIC:  Normal affect    Signed, Norman Herrlich, MD  01/03/2019 5:07 PM    [facility]

## 2019-01-03 ENCOUNTER — Other Ambulatory Visit: Payer: Self-pay

## 2019-01-03 ENCOUNTER — Encounter: Payer: Self-pay | Admitting: Cardiology

## 2019-01-03 ENCOUNTER — Ambulatory Visit (INDEPENDENT_AMBULATORY_CARE_PROVIDER_SITE_OTHER): Payer: PPO | Admitting: Cardiology

## 2019-01-03 VITALS — BP 124/80 | HR 76 | Temp 97.3°F | Wt 158.1 lb

## 2019-01-03 DIAGNOSIS — Z7901 Long term (current) use of anticoagulants: Secondary | ICD-10-CM

## 2019-01-03 DIAGNOSIS — I420 Dilated cardiomyopathy: Secondary | ICD-10-CM

## 2019-01-03 DIAGNOSIS — Z79899 Other long term (current) drug therapy: Secondary | ICD-10-CM | POA: Diagnosis not present

## 2019-01-03 DIAGNOSIS — I4892 Unspecified atrial flutter: Secondary | ICD-10-CM

## 2019-01-03 DIAGNOSIS — R42 Dizziness and giddiness: Secondary | ICD-10-CM

## 2019-01-03 DIAGNOSIS — I110 Hypertensive heart disease with heart failure: Secondary | ICD-10-CM

## 2019-01-03 NOTE — Telephone Encounter (Signed)
Patient is scheduled for an office visit with Dr. Dulce Sellar today, 01/03/2019, at 4:30 pm.

## 2019-01-03 NOTE — Patient Instructions (Signed)
Medication Instructions:  Your physician recommends that you continue on your current medications as directed. Please refer to the Current Medication list given to you today.  If you need a refill on your cardiac medications before your next appointment, please call your pharmacy.   Lab work: Your physician recommends that you return for lab work today: TSH, T3, T4, ProBNP.  If you have labs (blood work) drawn today and your tests are completely normal, you will receive your results only by: Marland Kitchen MyChart Message (if you have MyChart) OR . A paper copy in the mail If you have any lab test that is abnormal or we need to change your treatment, we will call you to review the results.  Testing/Procedures: Your physician has requested that you have an echocardiogram. Echocardiography is a painless test that uses sound waves to create images of your heart. It provides your doctor with information about the size and shape of your heart and how well your heart's chambers and valves are working. This procedure takes approximately one hour. There are no restrictions for this procedure.    Follow-Up: At [facility], you and your health needs are our priority.  As part of our continuing mission to provide you with exceptional heart care, we have created designated Provider Care Teams.  These Care Teams include your primary Cardiologist (physician) and Advanced Practice Providers (APPs -  Physician Assistants and Nurse Practitioners) who all work together to provide you with the care you need, when you need it. You will need a follow up appointment in 4 weeks.       Echocardiogram An echocardiogram is a procedure that uses painless sound waves (ultrasound) to produce an image of the heart. Images from an echocardiogram can provide important information about:  Signs of coronary artery disease (CAD).  Aneurysm detection. An aneurysm is a weak or damaged part of an artery wall that bulges out from the  normal force of blood pumping through the body.  Heart size and shape. Changes in the size or shape of the heart can be associated with certain conditions, including heart failure, aneurysm, and CAD.  Heart muscle function.  Heart valve function.  Signs of a past heart attack.  Fluid buildup around the heart.  Thickening of the heart muscle.  A tumor or infectious growth around the heart valves. Tell a health care provider about:  Any allergies you have.  All medicines you are taking, including vitamins, herbs, eye drops, creams, and over-the-counter medicines.  Any blood disorders you have.  Any surgeries you have had.  Any medical conditions you have.  Whether you are pregnant or may be pregnant. What are the risks? Generally, this is a safe procedure. However, problems may occur, including:  Allergic reaction to dye (contrast) that may be used during the procedure. What happens before the procedure? No specific preparation is needed. You may eat and drink normally. What happens during the procedure?   An IV tube may be inserted into one of your veins.  You may receive contrast through this tube. A contrast is an injection that improves the quality of the pictures from your heart.  A gel will be applied to your chest.  A wand-like tool (transducer) will be moved over your chest. The gel will help to transmit the sound waves from the transducer.  The sound waves will harmlessly bounce off of your heart to allow the heart images to be captured in real-time motion. The images will be recorded on  a computer. The procedure may vary among health care providers and hospitals. What happens after the procedure?  You may return to your normal, everyday life, including diet, activities, and medicines, unless your health care provider tells you not to do that. Summary  An echocardiogram is a procedure that uses painless sound waves (ultrasound) to produce an image of the  heart.  Images from an echocardiogram can provide important information about the size and shape of your heart, heart muscle function, heart valve function, and fluid buildup around your heart.  You do not need to do anything to prepare before this procedure. You may eat and drink normally.  After the echocardiogram is completed, you may return to your normal, everyday life, unless your health care provider tells you not to do that. This information is not intended to replace advice given to you by your health care provider. Make sure you discuss any questions you have with your health care provider. Document Released: 07/21/2000 Document Revised: 08/26/2016 Document Reviewed: 08/26/2016 Elsevier Interactive Patient Education  2019 [employer].

## 2019-01-04 LAB — TSH+T4F+T3FREE
Free T4: 2.51 ng/dL — ABNORMAL HIGH (ref 0.82–1.77)
T3, Free: 2.5 pg/mL (ref 2.0–4.4)
TSH: 0.3 u[IU]/mL — ABNORMAL LOW (ref 0.450–4.500)

## 2019-01-04 LAB — PRO B NATRIURETIC PEPTIDE: NT-Pro BNP: 944 pg/mL — ABNORMAL HIGH (ref 0–486)

## 2019-01-06 ENCOUNTER — Telehealth: Payer: Self-pay | Admitting: *Deleted

## 2019-01-06 NOTE — Telephone Encounter (Signed)
Left message for patient to return call to discuss.

## 2019-01-06 NOTE — Telephone Encounter (Signed)
-----   Message from Brian J Munley, MD sent at 01/06/2019 11:03 AM EDT ----- Thyroid looks excessive. Stop amiodarone and needs to see endocrinology   May be cause of his weakness. 

## 2019-01-07 ENCOUNTER — Telehealth: Payer: Self-pay | Admitting: *Deleted

## 2019-01-07 DIAGNOSIS — Z79899 Other long term (current) drug therapy: Secondary | ICD-10-CM

## 2019-01-07 DIAGNOSIS — E0590 Thyrotoxicosis, unspecified without thyrotoxic crisis or storm: Secondary | ICD-10-CM

## 2019-01-07 NOTE — Telephone Encounter (Signed)
See most recent encounter.  

## 2019-01-07 NOTE — Telephone Encounter (Signed)
-----   Message from Baldo Daub, MD sent at 01/06/2019 11:03 AM EDT ----- Thyroid looks excessive. Stop amiodarone and needs to see endocrinology   May be cause of his weakness.

## 2019-01-07 NOTE — Telephone Encounter (Signed)
Patient's son, [name], per [facility] informed of lab results and advised to stop patient's amiodarone. [name] verbalized understanding. Advised that Dr. Dulce Sellar recommends patient be seen by endocrinology. [name] is agreeable to plan. Referral has been placed. No further questions.

## 2019-01-20 ENCOUNTER — Encounter: Payer: Self-pay | Admitting: Internal Medicine

## 2019-01-20 ENCOUNTER — Ambulatory Visit: Payer: PPO | Admitting: Internal Medicine

## 2019-01-20 ENCOUNTER — Other Ambulatory Visit: Payer: Self-pay

## 2019-01-20 VITALS — BP 118/72 | HR 63 | Temp 97.5°F | Ht 65.0 in | Wt 152.2 lb

## 2019-01-20 DIAGNOSIS — E0580 Other thyrotoxicosis without thyrotoxic crisis or storm: Secondary | ICD-10-CM | POA: Diagnosis not present

## 2019-01-20 DIAGNOSIS — T462X5A Adverse effect of other antidysrhythmic drugs, initial encounter: Secondary | ICD-10-CM

## 2019-01-20 MED ORDER — METHIMAZOLE 10 MG PO TABS: 15.0000 mg | ORAL_TABLET | Freq: Every day | ORAL | 2 refills | Status: DC

## 2019-01-20 NOTE — Progress Notes (Signed)
Name: Bryan Paul  MRN/ DOB: [id number], 26-May-1935    Age/ Sex: 84 y.o., male    PCP: Marylen PontoHolt, Lynley S, MD   Reason for Endocrinology Evaluation: Hyperthyroidism     Date of Initial Endocrinology Evaluation: 01/20/2019     HPI: Bryan Paul is a 84 y.o. male with a past medical history of Atrial Flutter, HTN and dilated cardiomyopathy. The patient presented for initial endocrinology clinic visit on 01/20/2019 for consultative assistance with his hyperthyroidism.   Pt with Atrial flutter and dilated cardiomyopathy, he was started on Amiodarone therapy on 03/05/2018. He is S/p cardioversion on 01/16/2018. Pt was found to have a low TSH on 01/03/2019 at 0.300 uIU/mL with elevated FT4 at 2.51 ng/dL and normal T3.He was c/o fatigue at the time.    He is accompanied by his son, Larita FifeLynn who provided most of the history , as the patient is very hard of hearing.  He has had loss of appetite and 10 lbs weight loss in the past year, associated with fatigue, exertional dyspnea and tremors with anxiety.    No heat intolerance or diarrhea.  No local neck symptoms   He is not on any OTC biotin   No FH of thyroid disease  He has been off Amiodarone since 01/07/2019   HISTORY:  Past Medical History:  Past Medical History:  Diagnosis Date  . Adhesive capsulitis of shoulder 12/20/2009   Qualifier: Diagnosis of  By: Lovell SheehanJenkins MD, Balinda QuailsJohn E   . Anxiety 06/13/2011  . Atrial flutter (HCC) 03/06/2017  . CHF (congestive heart failure) (HCC) 01/07/2018  . CONDUCTIVE HEARING LOSS BILATERAL 06/16/2008   Qualifier: Diagnosis of  By: Lovell SheehanJenkins MD, John E   . Dilated cardiomyopathy (HCC) 03/06/2017  . Fatigue 05/17/2017  . Hypertension   . HYPERTENSION 03/12/2007   Qualifier: Diagnosis of  By: Mayford KnifeWilliams, LPN, Domenic PoliteBonnye M   . HYPERTROPHY PROSTATE W/UR OBST & OTH LUTS 04/28/2009   Qualifier: Diagnosis of  By: Lovell SheehanJenkins MD, Balinda QuailsJohn E   . ING HERN W/OBST W/O MENTION GANGREN UNILAT/UNS 10/31/2007   Qualifier:  Diagnosis of  By: Lovell SheehanJenkins MD, Balinda QuailsJohn E   . Weight loss 06/13/2011   Past Surgical History:  Past Surgical History:  Procedure Laterality Date  . colonscopy  09/07/2006      Social History:  reports that he has quit smoking. His smoking use included cigarettes. He has never used smokeless tobacco. He reports that he does not drink alcohol or use drugs.  Family History: family history includes Aneurysm in an other family member; Anxiety disorder in his sister; Stroke in his father.   HOME MEDICATIONS: Allergies as of 01/20/2019   No Known Allergies     Medication List       Accurate as of January 20, 2019 12:26 PM. If you have any questions, ask your nurse or doctor.        aspirin EC 81 MG tablet Take 1 tablet (81 mg total) by mouth daily.   citalopram 10 MG tablet Commonly known as: CELEXA Take 10 mg by mouth daily.   lisinopril 2.5 MG tablet Commonly known as: ZESTRIL TK 1 T PO D   LORazepam 0.5 MG tablet Commonly known as: ATIVAN TAKE 1 TABLET BY MOUTH TWICE DAILY AS NEEDED What changed:   how much to take  how to take this  when to take this  additional instructions   methimazole 10 MG tablet Commonly known as: TAPAZOLE Take 1.5 tablets (15 mg total)  by mouth daily. Started by: Scarlette Shorts, MD   metoprolol succinate 25 MG 24 hr tablet Commonly known as: TOPROL-XL Take 25 mg by mouth daily.         REVIEW OF SYSTEMS: A comprehensive ROS was conducted with the patient and is negative except as per HPI and below:  Review of Systems  Constitutional: Positive for malaise/fatigue and weight loss.  HENT: Positive for hearing loss. Negative for congestion.   Eyes: Negative for blurred vision and pain.  Respiratory: Positive for cough. Negative for shortness of breath.   Cardiovascular: Negative for chest pain and palpitations.  Gastrointestinal: Negative for diarrhea and nausea.  Skin: Negative.   Neurological: Positive for tremors. Negative for  tingling.  Psychiatric/Behavioral: Negative for memory loss. The patient is nervous/anxious.        OBJECTIVE:  VS: BP 118/72 (BP Location: Left Arm, Patient Position: Sitting, Cuff Size: Normal)   Pulse 63   Temp (!) 97.5 F (36.4 C)   Ht 5\' 5"  (1.651 m)   Wt 152 lb 3.2 oz (69 kg)   SpO2 98%   BMI 25.33 kg/m    Wt Readings from Last 3 Encounters:  01/20/19 152 lb 3.2 oz (69 kg)  01/03/19 158 lb 1.3 oz (71.7 kg)  03/05/18 162 lb 12.8 oz (73.8 kg)     EXAM: General: Pt appears well and is in NAD  Eyes: External eye exam normal without stare, lid lag or exophthalmos.  EOM intact.   Ears, Nose, Throat: Hearing: Very poor  Throat: Clear without mass, erythema or exudate  Neck: General: Supple without adenopathy. Thyroid: Thyroid size normal.  No goiter or nodules appreciated. No thyroid bruit.  Lungs: Clear with good BS bilat with no rales, rhonchi, or wheezes  Heart: Auscultation: RRR.  Abdomen: Normoactive bowel sounds, soft, nontender, without masses or organomegaly palpable  Extremities:  BL LE: No pretibial edema normal ROM and strength.  Skin: Hair: Texture and amount normal with gender appropriate distribution Skin Inspection: No rashes. Skin Palpation: Skin temperature, texture, and thickness normal to palpation  Neuro: Cranial nerves: II - XII grossly intact  Motor: Normal strength throughout DTRs: 2+ and symmetric in UE without delay in relaxation phase  Mental Status: Judgment, insight: Intact Orientation: Oriented to time, place, and person Mood and affect: No depression, anxiety, or agitation     DATA REVIEWED: Results for CORKEY, SISK (MRN [id number]) as of 01/27/2019 15:18  Ref. Range 03/05/2018 11:13 01/03/2019 17:10  TSH Latest Ref Range: 0.450 - 4.500 uIU/mL 0.592 0.300 (L)  Triiodothyronine,Free,Serum Latest Ref Range: 2.0 - 4.4 pg/mL  2.5  T4,Free(Direct) Latest Ref Range: 0.82 - 1.77 ng/dL  8.65 (H)      ASSESSMENT/PLAN/RECOMMENDATIONS:    1. Hyperthyroidism in the setting of Amiodarone Use:   - Last amiodarone use was on 01/07/2019 - Amiodarone could cause 2 types of thyroid dysfunction, in Type I there's increase in thyroid hormone synthesis and is treated with thionamide therapy but in Type II is due to release of thyroid hormone due to inflammation which is typically treated with glucocorticoids.  - Clinically it may be difficult to differentiate between the two, as a lot of them may have a mixture of both.  - We will start him on methimazole at this time, I am trying to avoid prednisone given prior hx of CHF. If no response or working TFT in 2 weeks, will consider starting prednisone at the time - Will proceed with thyroid ultrasound.  -  We discussed with pt the benefits of methimazole in the Tx of hyperthyroidism, as well as the possible side effects/complications of anti-thyroid drug Tx (specifically detailing the rare, but serious side effect of agranulocytosis). His son was informed of need for regular thyroid function monitoring while on methimazole to ensure appropriate dosage without over-treatment. As well, we discussed the possible side effects of methimazole including the chance of rash, the small chance of liver irritation/juandice and the <=1 in 300-400 chance of sudden onset agranulocytosis.  We discussed importance of going to ED promptly (and stopping methimazole) if he were to develop significant fever with severe sore throat of other evidence of acute infection.      Medications :  Methimazole 15 mg daily  Labs in 2 weeks      F/u in 2 months      Signed electronically by: Mack Guise, MD  [facility]  [facility] [address] [place], [place] [address] Phone: [phone] FAX: [phone]   CC: Ronita Hipps, MD [address],[place] [address] Phone: [phone] Fax: [phone]   Return to Endocrinology clinic as below: Future  Appointments  Date Time Provider [place]  01/30/2019  1:15 PM [facility] [place] 1 [facility] None  01/31/2019  3:15 PM Richardo Priest, MD [facility] None  03/26/2019  9:30 AM Leif Loflin, Melanie Crazier, MD [facility] None

## 2019-01-20 NOTE — Patient Instructions (Signed)
We recommend that you follow these hyperthyroidism instructions at home:  1) Take Methimazole 15 mg once a day  If you develop severe sore throat with high fevers OR develop unexplained yellowing of your skin, eyes, under your tongue, severe abdominal pain with nausea or vomiting --> then please get evaluated immediately.  2) Get repeat thyroid labs in 2 weeks .   It is ESSENTIAL to get follow-up labs to help avoid over or undertreatment of your hyperthyroidism - both of which can be dangerous to your health.

## 2019-01-21 ENCOUNTER — Other Ambulatory Visit: Payer: Self-pay

## 2019-01-30 ENCOUNTER — Other Ambulatory Visit: Payer: Self-pay

## 2019-01-30 ENCOUNTER — Ambulatory Visit (INDEPENDENT_AMBULATORY_CARE_PROVIDER_SITE_OTHER): Payer: PPO

## 2019-01-30 DIAGNOSIS — I420 Dilated cardiomyopathy: Secondary | ICD-10-CM

## 2019-01-30 DIAGNOSIS — I110 Hypertensive heart disease with heart failure: Secondary | ICD-10-CM | POA: Diagnosis not present

## 2019-01-30 DIAGNOSIS — I4892 Unspecified atrial flutter: Secondary | ICD-10-CM

## 2019-01-30 NOTE — Progress Notes (Signed)
2D Echocardiogram performed   01/30/19 Joseph Dehlia Kilner RDCS, RVT 

## 2019-01-31 ENCOUNTER — Ambulatory Visit: Payer: PPO | Admitting: Cardiology

## 2019-02-03 DIAGNOSIS — E0590 Thyrotoxicosis, unspecified without thyrotoxic crisis or storm: Secondary | ICD-10-CM | POA: Insufficient documentation

## 2019-02-03 NOTE — Progress Notes (Signed)
Cardiology Office Note:    Date:  02/04/2019   ID:  Bryan Paul, DOB [birth year], MRN [id number]  PCP:  Ronita Hipps, MD  Cardiologist:  Shirlee More, MD    Referring MD: Ronita Hipps, MD    ASSESSMENT:    1. Atrial flutter, unspecified type ([place])   2. Hyperthyroidism   3. Chronic diastolic congestive heart failure ([place])   4. Dilated cardiomyopathy ([place])   5. Fatigue, unspecified type    PLAN:    In order of problems listed above:  1. Atrial flutter - No recurrence off amiodarone. Hesitant to add anti-arrthymic at this time. His son will call with HR >100 and monitor daily.  2. Hyperthyroidism - Thyrotoxic secondary to amiodarone on 01/03/19 - amiodarone stopped. Following with endocrinology.  3. Chronic diastolic heart failure/Dilated cardiomyopathy - EF normalized via echo 01/30/19 EF 60-65%. LV doppler parameters consistent with impaired. No edema nor SOB. BP marginal today 104/68. Will stop Lisinopril secondary to cough.  4. Fatigue - Continued fatigue. Encouraged that medications for hyperthyroidism may take a few weeks to work. Encouraged him and his son to continue Ensure and PT exercises.    Next appointment: 3 months.    Medication Adjustments/Labs and Tests Ordered: Current medicines are reviewed at length with the patient today.  Concerns regarding medicines are outlined above.  No orders of the defined types were placed in this encounter.  No orders of the defined types were placed in this encounter.   Chief Complaint  Patient presents with   Atrial Flutter   Hyperthyroidism    History of Present Illness:    Bryan Paul is a 84 y.o. male with a hx of  a hx of PVCs, paroxysmal atrialf lutter, previous dilated cardiomyopathy, and chronic anti-coagulation. He was last seen by me 03/05/18 and started on amiodarone to prevent recurrent flutter after cardioversion 01/16/18  last seen 01/03/2019.  He is found to have hyperthyroidism associated  with amiodarone that was promptly discontinued and seen in consultation by Endocrinology.  Seen today with his son [name]. Reports they have been seen by endocrinology and has thyroid ultrasound upcoming. Started on methimazole.   Reports some increase in lower extremity strength, but still weak. Has been doing PT exercises at home.  Continued low appetite, but has been taking a sandwich per day and son is encouraging two Ensures. Denies palpitations, chest pain, edema.   Reports longstanding "hacking" cough with current Lisinopril. Will discontinue.   Compliance with diet, lifestyle and medications: Yes  Past Medical History:  Diagnosis Date   Adhesive capsulitis of shoulder 12/20/2009   Qualifier: Diagnosis of  By: Arnoldo Morale MD, John Johnette Abraham    Anxiety 06/13/2011   Atrial flutter ([place]) 03/06/2017   CHF (congestive heart failure) ([place]) 01/07/2018   CONDUCTIVE HEARING LOSS BILATERAL 06/16/2008   Qualifier: Diagnosis of  By: Arnoldo Morale MD, John E    Dilated cardiomyopathy ([place]) 03/06/2017   Fatigue 05/17/2017   Hypertension    HYPERTENSION 03/12/2007   Qualifier: Diagnosis of  By: Jimmye Norman, LPN, Winfield Cunas    HYPERTROPHY PROSTATE W/UR OBST & OTH LUTS 04/28/2009   Qualifier: Diagnosis of  By: Arnoldo Morale MD, Balinda Quails    ING HERN W/OBST W/O MENTION GANGREN UNILAT/UNS 10/31/2007   Qualifier: Diagnosis of  By: Arnoldo Morale MD, Balinda Quails    Weight loss 06/13/2011    Past Surgical History:  Procedure Laterality Date   colonscopy  09/07/2006    Current Medications: Current Meds  Medication Sig  acetaminophen (TYLENOL) 500 MG tablet Take 500 mg by mouth every 6 (six) hours as needed.   aspirin EC 81 MG tablet Take 1 tablet (81 mg total) by mouth daily.   citalopram (CELEXA) 10 MG tablet Take 20 mg by mouth daily.    LORazepam (ATIVAN) 0.5 MG tablet TAKE 1 TABLET BY MOUTH TWICE DAILY AS NEEDED   methimazole (TAPAZOLE) 10 MG tablet Take 1.5 tablets (15 mg total) by mouth daily.   metoprolol succinate  (TOPROL-XL) 25 MG 24 hr tablet Take 25 mg by mouth daily.   [DISCONTINUED] lisinopril (PRINIVIL,ZESTRIL) 2.5 MG tablet TK 1 T PO D     Allergies:   Patient has no known allergies.   Social History   Socioeconomic History   Marital status: Married    Spouse name: Not on file   Number of children: Not on file   Years of education: Not on file   Highest education level: Not on file  Occupational History   Not on file  Social Needs   Financial resource strain: Not on file   Food insecurity    Worry: Not on file    Inability: Not on file   Transportation needs    Medical: Not on file    Non-medical: Not on file  Tobacco Use   Smoking status: Former Smoker    Types: Cigarettes   Smokeless tobacco: Never Used  Substance and Sexual Activity   Alcohol use: No   Drug use: No   Sexual activity: Not on file  Lifestyle   Physical activity    Days per week: Not on file    Minutes per session: Not on file   Stress: Not on file  Relationships   Social connections    Talks on phone: Not on file    Gets together: Not on file    Attends religious service: Not on file    Active member of club or organization: Not on file    Attends meetings of clubs or organizations: Not on file    Relationship status: Not on file  Other Topics Concern   Not on file  Social History Narrative   Not on file     Family History: The patient's family history includes Aneurysm in an other family member; Anxiety disorder in his sister; Stroke in his father. There is no history of CAD or Sudden Cardiac Death. ROS:   Please see the history of present illness.    Review of Systems  Constitution: Positive for malaise/fatigue. Negative for chills and fever.  Cardiovascular: Negative for chest pain, dyspnea on exertion, irregular heartbeat, leg swelling and palpitations.  Respiratory: Positive for cough (chronic per report). Negative for shortness of breath and wheezing.   Neurological:  Positive for tremors (hands) and weakness. Negative for light-headedness.    All other systems reviewed and are negative.  EKGs/Labs/Other Studies Reviewed:    The following studies were reviewed today:  EKG:  EKG ordered today and personally reviewed.  The ekg ordered today demonstrates echocardiogram 01/30/2019 showed ejection fraction 60 to 65% and no significant valvular abnormality.  Recent Labs: 03/05/2018: ALT 13; BUN 23; Creatinine, Ser 0.89; Potassium 4.8; Sodium 137 01/03/2019: NT-Pro BNP 944; TSH 0.300  Recent Lipid Panel    Component Value Date/Time   CHOL 165 04/21/2009 1023   TRIG 59.0 04/21/2009 1023   TRIG 63 06/04/2006 1215   HDL 46.10 04/21/2009 1023   CHOLHDL 4 04/21/2009 1023   VLDL 11.8 04/21/2009 1023   LDLCALC  107 (H) 04/21/2009 1023    Physical Exam:    VS:  BP 104/68 (BP Location: Right Arm, Patient Position: Sitting, Cuff Size: Normal)    Pulse 66    Temp (!) 96.8 F (36 C)    Ht 5\' 5"  (1.651 m)    Wt 147 lb 1.9 oz (66.7 kg)    SpO2 97%    BMI 24.48 kg/m     Wt Readings from Last 3 Encounters:  02/04/19 147 lb 1.9 oz (66.7 kg)  01/20/19 152 lb 3.2 oz (69 kg)  01/03/19 158 lb 1.3 oz (71.7 kg)     GEN:  Thin, well developed in no acute distress HEENT: Normal. Hard of hearing.  NECK: No JVD; No carotid or thyroid bruits, LYMPHATICS: No lymphadenopathy CARDIAC: RRR, no murmurs, rubs, gallops RESPIRATORY:  Clear to auscultation without rales, wheezing or rhonchi  ABDOMEN: Soft, non-tender, non-distended MUSCULOSKELETAL:  No edema; No deformity. Mild tremor SKIN: Warm and dry NEUROLOGIC:  Alert and oriented x 3 PSYCHIATRIC:  Normal affect    Signed, Norman HerrlichBrian Damion Kant, MD  02/04/2019 11:18 AM    [facility]

## 2019-02-04 ENCOUNTER — Other Ambulatory Visit: Payer: Self-pay

## 2019-02-04 ENCOUNTER — Ambulatory Visit (INDEPENDENT_AMBULATORY_CARE_PROVIDER_SITE_OTHER): Payer: PPO | Admitting: Cardiology

## 2019-02-04 ENCOUNTER — Encounter: Payer: Self-pay | Admitting: Cardiology

## 2019-02-04 VITALS — BP 104/68 | HR 66 | Temp 96.8°F | Ht 65.0 in | Wt 147.1 lb

## 2019-02-04 DIAGNOSIS — R5383 Other fatigue: Secondary | ICD-10-CM

## 2019-02-04 DIAGNOSIS — I420 Dilated cardiomyopathy: Secondary | ICD-10-CM

## 2019-02-04 DIAGNOSIS — I4892 Unspecified atrial flutter: Secondary | ICD-10-CM | POA: Diagnosis not present

## 2019-02-04 DIAGNOSIS — I482 Chronic atrial fibrillation, unspecified: Secondary | ICD-10-CM | POA: Diagnosis not present

## 2019-02-04 DIAGNOSIS — E0590 Thyrotoxicosis, unspecified without thyrotoxic crisis or storm: Secondary | ICD-10-CM | POA: Diagnosis not present

## 2019-02-04 DIAGNOSIS — I5032 Chronic diastolic (congestive) heart failure: Secondary | ICD-10-CM

## 2019-02-04 DIAGNOSIS — I10 Essential (primary) hypertension: Secondary | ICD-10-CM | POA: Diagnosis not present

## 2019-02-04 NOTE — Patient Instructions (Signed)
Medication Instructions:  Your physician has recommended you make the following change in your medication:   STOP: lisinopril  If you need a refill on your cardiac medications before your next appointment, please call your pharmacy.   Lab work: None If you have labs (blood work) drawn today and your tests are completely normal, you will receive your results only by: Marland Kitchen MyChart Message (if you have MyChart) OR . A paper copy in the mail If you have any lab test that is abnormal or we need to change your treatment, we will call you to review the results.  Testing/Procedures: None  Follow-Up: At [facility], you and your health needs are our priority.  As part of our continuing mission to provide you with exceptional heart care, we have created designated Provider Care Teams.  These Care Teams include your primary Cardiologist (physician) and Advanced Practice Providers (APPs -  Physician Assistants and Nurse Practitioners) who all work together to provide you with the care you need, when you need it. You will need a follow up appointment in 3 months.  Any Other Special Instructions Will Be Listed Below (If Applicable).   KEEP A BLOOD PRESSURE AND HEART RATE DAILY LOG  IF SYSTOLIC BLOOD PRESSURE (TOP NUMBER) IS GREATER HAN 150 CONSISTENTLY CALL THE OFFICE  IF HEART RATE IS CONSISTENTLY GREATER THAN 100 CALL THE OFFICE.

## 2019-02-06 ENCOUNTER — Ambulatory Visit
Admission: RE | Admit: 2019-02-06 | Discharge: 2019-02-06 | Disposition: A | Discharge status: Home / Self Care | Payer: PPO | Source: Ambulatory Visit | Attending: Internal Medicine | Admitting: Internal Medicine

## 2019-02-06 ENCOUNTER — Other Ambulatory Visit: Payer: Self-pay

## 2019-02-06 DIAGNOSIS — E041 Nontoxic single thyroid nodule: Secondary | ICD-10-CM | POA: Diagnosis not present

## 2019-02-06 DIAGNOSIS — E0580 Other thyrotoxicosis without thyrotoxic crisis or storm: Secondary | ICD-10-CM

## 2019-02-12 ENCOUNTER — Inpatient Hospital Stay (HOSPITAL_COMMUNITY)
Admission: EM | Admit: 2019-02-12 | Discharge: 2019-02-21 | DRG: 480 | Disposition: A | Discharge status: Skilled Nursing Facility | Payer: PPO | Attending: Family Medicine | Admitting: Family Medicine

## 2019-02-12 ENCOUNTER — Encounter (HOSPITAL_COMMUNITY): Payer: Self-pay | Admitting: Internal Medicine

## 2019-02-12 ENCOUNTER — Other Ambulatory Visit: Payer: Self-pay

## 2019-02-12 ENCOUNTER — Telehealth: Payer: Self-pay | Admitting: Internal Medicine

## 2019-02-12 ENCOUNTER — Emergency Department (HOSPITAL_COMMUNITY): Payer: PPO

## 2019-02-12 DIAGNOSIS — S79912A Unspecified injury of left hip, initial encounter: Secondary | ICD-10-CM | POA: Diagnosis not present

## 2019-02-12 DIAGNOSIS — Z1159 Encounter for screening for other viral diseases: Secondary | ICD-10-CM | POA: Diagnosis not present

## 2019-02-12 DIAGNOSIS — R296 Repeated falls: Secondary | ICD-10-CM | POA: Diagnosis present

## 2019-02-12 DIAGNOSIS — I10 Essential (primary) hypertension: Secondary | ICD-10-CM | POA: Diagnosis present

## 2019-02-12 DIAGNOSIS — H900 Conductive hearing loss, bilateral: Secondary | ICD-10-CM | POA: Diagnosis present

## 2019-02-12 DIAGNOSIS — S72002A Fracture of unspecified part of neck of left femur, initial encounter for closed fracture: Secondary | ICD-10-CM | POA: Diagnosis not present

## 2019-02-12 DIAGNOSIS — S72002D Fracture of unspecified part of neck of left femur, subsequent encounter for closed fracture with routine healing: Secondary | ICD-10-CM | POA: Diagnosis not present

## 2019-02-12 DIAGNOSIS — S7222XA Displaced subtrochanteric fracture of left femur, initial encounter for closed fracture: Principal | ICD-10-CM | POA: Diagnosis present

## 2019-02-12 DIAGNOSIS — Z96649 Presence of unspecified artificial hip joint: Secondary | ICD-10-CM

## 2019-02-12 DIAGNOSIS — M6281 Muscle weakness (generalized): Secondary | ICD-10-CM | POA: Diagnosis not present

## 2019-02-12 DIAGNOSIS — R531 Weakness: Secondary | ICD-10-CM | POA: Diagnosis not present

## 2019-02-12 DIAGNOSIS — Y92009 Unspecified place in unspecified non-institutional (private) residence as the place of occurrence of the external cause: Secondary | ICD-10-CM | POA: Diagnosis not present

## 2019-02-12 DIAGNOSIS — S72009A Fracture of unspecified part of neck of unspecified femur, initial encounter for closed fracture: Secondary | ICD-10-CM | POA: Diagnosis present

## 2019-02-12 DIAGNOSIS — E032 Hypothyroidism due to medicaments and other exogenous substances: Secondary | ICD-10-CM | POA: Diagnosis present

## 2019-02-12 DIAGNOSIS — R1312 Dysphagia, oropharyngeal phase: Secondary | ICD-10-CM | POA: Diagnosis not present

## 2019-02-12 DIAGNOSIS — W19XXXA Unspecified fall, initial encounter: Secondary | ICD-10-CM | POA: Diagnosis not present

## 2019-02-12 DIAGNOSIS — S72142A Displaced intertrochanteric fracture of left femur, initial encounter for closed fracture: Secondary | ICD-10-CM

## 2019-02-12 DIAGNOSIS — E43 Unspecified severe protein-calorie malnutrition: Secondary | ICD-10-CM | POA: Diagnosis not present

## 2019-02-12 DIAGNOSIS — R2681 Unsteadiness on feet: Secondary | ICD-10-CM | POA: Diagnosis not present

## 2019-02-12 DIAGNOSIS — W1830XA Fall on same level, unspecified, initial encounter: Secondary | ICD-10-CM | POA: Diagnosis present

## 2019-02-12 DIAGNOSIS — F419 Anxiety disorder, unspecified: Secondary | ICD-10-CM | POA: Diagnosis not present

## 2019-02-12 DIAGNOSIS — I509 Heart failure, unspecified: Secondary | ICD-10-CM

## 2019-02-12 DIAGNOSIS — Z03818 Encounter for observation for suspected exposure to other biological agents ruled out: Secondary | ICD-10-CM | POA: Diagnosis not present

## 2019-02-12 DIAGNOSIS — I5032 Chronic diastolic (congestive) heart failure: Secondary | ICD-10-CM | POA: Diagnosis present

## 2019-02-12 DIAGNOSIS — S72142E Displaced intertrochanteric fracture of left femur, subsequent encounter for open fracture type I or II with routine healing: Secondary | ICD-10-CM | POA: Diagnosis not present

## 2019-02-12 DIAGNOSIS — R9431 Abnormal electrocardiogram [ECG] [EKG]: Secondary | ICD-10-CM | POA: Diagnosis not present

## 2019-02-12 DIAGNOSIS — Z9181 History of falling: Secondary | ICD-10-CM | POA: Diagnosis not present

## 2019-02-12 DIAGNOSIS — E0590 Thyrotoxicosis, unspecified without thyrotoxic crisis or storm: Secondary | ICD-10-CM | POA: Diagnosis not present

## 2019-02-12 DIAGNOSIS — I420 Dilated cardiomyopathy: Secondary | ICD-10-CM | POA: Diagnosis present

## 2019-02-12 DIAGNOSIS — N401 Enlarged prostate with lower urinary tract symptoms: Secondary | ICD-10-CM | POA: Diagnosis not present

## 2019-02-12 DIAGNOSIS — Z419 Encounter for procedure for purposes other than remedying health state, unspecified: Secondary | ICD-10-CM

## 2019-02-12 DIAGNOSIS — M25552 Pain in left hip: Secondary | ICD-10-CM | POA: Diagnosis not present

## 2019-02-12 DIAGNOSIS — S72102A Unspecified trochanteric fracture of left femur, initial encounter for closed fracture: Secondary | ICD-10-CM | POA: Diagnosis not present

## 2019-02-12 DIAGNOSIS — Z741 Need for assistance with personal care: Secondary | ICD-10-CM | POA: Diagnosis not present

## 2019-02-12 DIAGNOSIS — M2550 Pain in unspecified joint: Secondary | ICD-10-CM | POA: Diagnosis not present

## 2019-02-12 DIAGNOSIS — S299XXA Unspecified injury of thorax, initial encounter: Secondary | ICD-10-CM | POA: Diagnosis not present

## 2019-02-12 DIAGNOSIS — I4892 Unspecified atrial flutter: Secondary | ICD-10-CM | POA: Diagnosis not present

## 2019-02-12 DIAGNOSIS — D62 Acute posthemorrhagic anemia: Secondary | ICD-10-CM | POA: Diagnosis not present

## 2019-02-12 DIAGNOSIS — T462X5S Adverse effect of other antidysrhythmic drugs, sequela: Secondary | ICD-10-CM | POA: Diagnosis not present

## 2019-02-12 DIAGNOSIS — F0390 Unspecified dementia without behavioral disturbance: Secondary | ICD-10-CM | POA: Diagnosis not present

## 2019-02-12 DIAGNOSIS — I959 Hypotension, unspecified: Secondary | ICD-10-CM | POA: Diagnosis not present

## 2019-02-12 DIAGNOSIS — Z66 Do not resuscitate: Secondary | ICD-10-CM | POA: Diagnosis not present

## 2019-02-12 DIAGNOSIS — I110 Hypertensive heart disease with heart failure: Secondary | ICD-10-CM | POA: Diagnosis present

## 2019-02-12 DIAGNOSIS — R52 Pain, unspecified: Secondary | ICD-10-CM | POA: Diagnosis not present

## 2019-02-12 DIAGNOSIS — Z823 Family history of stroke: Secondary | ICD-10-CM | POA: Diagnosis not present

## 2019-02-12 DIAGNOSIS — Z87891 Personal history of nicotine dependence: Secondary | ICD-10-CM

## 2019-02-12 DIAGNOSIS — M2681 Anterior soft tissue impingement: Secondary | ICD-10-CM | POA: Diagnosis not present

## 2019-02-12 DIAGNOSIS — Z7401 Bed confinement status: Secondary | ICD-10-CM | POA: Diagnosis not present

## 2019-02-12 LAB — BASIC METABOLIC PANEL
Anion gap: 15 (ref 5–15)
BUN: 28 mg/dL — ABNORMAL HIGH (ref 8–23)
CO2: 18 mmol/L — ABNORMAL LOW (ref 22–32)
Calcium: 8.5 mg/dL — ABNORMAL LOW (ref 8.9–10.3)
Chloride: 101 mmol/L (ref 98–111)
Creatinine, Ser: 0.82 mg/dL (ref 0.61–1.24)
GFR calc Af Amer: >60 mL/min (ref >60)
GFR calc non Af Amer: >60 mL/min (ref >60)
Glucose, Bld: 118 mg/dL — ABNORMAL HIGH (ref 70–99)
Potassium: 3.8 mmol/L (ref 3.5–5.1)
Sodium: 134 mmol/L — ABNORMAL LOW (ref 135–145)

## 2019-02-12 LAB — CBC WITH DIFFERENTIAL/PLATELET
Abs Immature Granulocytes: 0.06 10*3/uL (ref 0.00–0.07)
Basophils Absolute: 0 10*3/uL (ref 0.0–0.1)
Basophils Relative: 0 %
Eosinophils Absolute: 0.1 10*3/uL (ref 0.0–0.5)
Eosinophils Relative: 1 %
HCT: 44.9 % (ref 39.0–52.0)
Hemoglobin: 15 g/dL (ref 13.0–17.0)
Immature Granulocytes: 1 %
Lymphocytes Relative: 12 %
Lymphs Abs: 1.2 10*3/uL (ref 0.7–4.0)
MCH: 29.6 pg (ref 26.0–34.0)
MCHC: 33.4 g/dL (ref 30.0–36.0)
MCV: 88.7 fL (ref 80.0–100.0)
Monocytes Absolute: 1 10*3/uL (ref 0.1–1.0)
Monocytes Relative: 10 %
Neutro Abs: 7.6 10*3/uL (ref 1.7–7.7)
Neutrophils Relative %: 76 %
Platelets: 269 10*3/uL (ref 150–400)
RBC: 5.06 MIL/uL (ref 4.22–5.81)
RDW: 13.2 % (ref 11.5–15.5)
WBC: 10 10*3/uL (ref 4.0–10.5)
nRBC: 0 % (ref 0.0–0.2)

## 2019-02-12 LAB — TYPE AND SCREEN
ABO/RH(D): O NEG
Antibody Screen: NEGATIVE

## 2019-02-12 LAB — PROTIME-INR
INR: 1.1 (ref 0.8–1.2)
Prothrombin Time: 14.1 seconds (ref 11.4–15.2)

## 2019-02-12 LAB — ABO/RH: ABO/RH(D): O NEG

## 2019-02-12 LAB — ALBUMIN: Albumin: 3.4 g/dL — ABNORMAL LOW (ref 3.5–5.0)

## 2019-02-12 LAB — SARS CORONAVIRUS 2 BY RT PCR (HOSPITAL ORDER, PERFORMED IN ~~LOC~~ HOSPITAL LAB): SARS Coronavirus 2: NEGATIVE

## 2019-02-12 MED ORDER — METHOCARBAMOL 500 MG PO TABS: 500.0000 mg | ORAL_TABLET | Freq: Four times a day (QID) | ORAL | Status: DC | PRN

## 2019-02-12 MED ORDER — CEFAZOLIN SODIUM-DEXTROSE 2-4 GM/100ML-% IV SOLN
2.0000 g | INTRAVENOUS | Status: AC
  Administered 2019-02-13: 2 g via INTRAVENOUS
  Filled 2019-02-12: qty 100

## 2019-02-12 MED ORDER — BISACODYL 10 MG RE SUPP: 10.0000 mg | Freq: Every day | RECTAL | Status: DC | PRN

## 2019-02-12 MED ORDER — TRANEXAMIC ACID-NACL 1000-0.7 MG/100ML-% IV SOLN
1000.0000 mg | INTRAVENOUS | Status: AC
  Administered 2019-02-13: 1000 mg via INTRAVENOUS
  Filled 2019-02-12: qty 100

## 2019-02-12 MED ORDER — FENTANYL CITRATE (PF) 100 MCG/2ML IJ SOLN
INTRAMUSCULAR | Status: AC
  Administered 2019-02-12: 100 ug
  Filled 2019-02-12: qty 2

## 2019-02-12 MED ORDER — MORPHINE SULFATE (PF) 2 MG/ML IV SOLN
0.5000 mg | INTRAVENOUS | Status: DC | PRN
  Administered 2019-02-13 (×2): 0.5 mg via INTRAVENOUS
  Filled 2019-02-12 (×2): qty 1

## 2019-02-12 MED ORDER — CITALOPRAM HYDROBROMIDE 20 MG PO TABS
20.0000 mg | ORAL_TABLET | Freq: Every day | ORAL | Status: DC
  Administered 2019-02-14 – 2019-02-21 (×8): 20 mg via ORAL
  Filled 2019-02-12 (×8): qty 1

## 2019-02-12 MED ORDER — POVIDONE-IODINE 10 % EX SWAB: 2.0000 "application " | Freq: Once | CUTANEOUS | Status: DC

## 2019-02-12 MED ORDER — HYDROCODONE-ACETAMINOPHEN 5-325 MG PO TABS
1.0000 | ORAL_TABLET | Freq: Four times a day (QID) | ORAL | Status: DC | PRN
  Filled 2019-02-12: qty 2

## 2019-02-12 MED ORDER — METHIMAZOLE 5 MG PO TABS
15.0000 mg | ORAL_TABLET | Freq: Every day | ORAL | Status: DC
  Administered 2019-02-13 – 2019-02-21 (×9): 15 mg via ORAL
  Filled 2019-02-12 (×9): qty 1

## 2019-02-12 MED ORDER — METOPROLOL TARTRATE 5 MG/5ML IV SOLN
2.5000 mg | Freq: Two times a day (BID) | INTRAVENOUS | Status: DC
  Administered 2019-02-13 – 2019-02-21 (×16): 2.5 mg via INTRAVENOUS
  Filled 2019-02-12 (×18): qty 5

## 2019-02-12 MED ORDER — LORAZEPAM 2 MG/ML IJ SOLN
0.5000 mg | Freq: Two times a day (BID) | INTRAMUSCULAR | Status: DC | PRN
  Administered 2019-02-16: 0.5 mg via INTRAVENOUS
  Filled 2019-02-12: qty 1

## 2019-02-12 MED ORDER — TRANEXAMIC ACID 1000 MG/10ML IV SOLN
2000.0000 mg | INTRAVENOUS | Status: DC
  Filled 2019-02-12: qty 20

## 2019-02-12 MED ORDER — ENSURE PRE-SURGERY PO LIQD
296.0000 mL | Freq: Once | ORAL | Status: DC
  Filled 2019-02-12: qty 296

## 2019-02-12 MED ORDER — METHOCARBAMOL 1000 MG/10ML IJ SOLN
500.0000 mg | Freq: Four times a day (QID) | INTRAVENOUS | Status: DC | PRN
  Filled 2019-02-12: qty 5

## 2019-02-12 MED ORDER — FENTANYL CITRATE (PF) 100 MCG/2ML IJ SOLN
50.0000 ug | INTRAMUSCULAR | Status: AC | PRN
  Administered 2019-02-12 (×2): 50 ug via INTRAVENOUS
  Filled 2019-02-12: qty 2

## 2019-02-12 NOTE — ED Triage Notes (Signed)
Per EMS: Pt is coming from home with c/o left hip pain and shortening of the left leg. Pt lives at home with his family and they found him lying on the floor.  Pt given 188mcg of fentanyl with EMS Pt reports that patient is legally deaf. Pt normally reads lips.  Pt's son's number is [phone] [name]) Pt is alert and oriented.  Pt has a hx of hyperthyroidism and anxiety.

## 2019-02-12 NOTE — H&P (Signed)
Bryan Paul:[id number] DOB: 1934-08-15 DOA: 02/12/2019     PCP: Marylen Ponto, MD   [facility]  CARDS:   Dr. Dulce Sellar    Endocrinology Shamleffer, Konrad Dolores, MD  Patient arrived to ER on 02/12/19 at 1610  Patient coming from: home Lives   With family    Chief Complaint:  Chief Complaint  Patient presents with  . Hip Pain  . Hip Injury    HPI: Mccrae Vyom Brass is a 84 y.o. male with medical history significant of Atrial Flutter, HTN and dilated cardiomyopathy, amiodarone induced Hyperthyroidism, very hard of hearing, mild dementia    Presented with   unwitnessed fall after family went on an errand came back very quickly home and found him in his bedroom on the floor with left leg shortened unable to get up.  Patient normally is deaf and reads lips. He is unable to provide detailed history but ER provider discussed with family extensively Apparently patient denied any loss of consciousness have not endorsed any recent chest pain or shortness of breath  EMS was called on site shortly after the fall and he received 100 mcg of fentanyl and was transferred to [facility] long  He is very weak at baseline looses balance a lot and prone for falls and gets PT at home once a week  He has fallen 4 times in the past few months, he supposed to use a walker but has been forgetful  He gets short of breath with activity and attributes that to weakness but denies chest pain  Family assists with bathing He has trouble emptying his bladder no foul smelling urine no dysuria No recent constipation    Infectious risk factors:  Reports none In  ER RAPID COVID TEST NEGATIVE    Regarding pertinent Chronic problems:    Hyperthyroidism followed by endocrinology currently on methimazole Days ago had an ultrasound done the thyroid found to have nodules And was for patient to undergo right nodule biopsy   HTN on   toprolol   CHF diastolic  dilated cardiomyopathy  last echo 01/30/19 EF 60-65% Lisinopril stopped due to cough   A. Flutter -  CHA2DS2 vas score 4 :   On Aspirin  Not on anticoagulation secondary to Risk of Falls, S/p cardioversion on 01/16/2018 Did not tolerate Amiodarone has been off since 01/07/19        -  Rate control:  Currently controlled with  Toprolol,       While in ER:  The following Work up has been ordered so far:  Orders Placed This Encounter  Procedures  . SARS Coronavirus 2 (CEPHEID - Performed in [facility] hospital lab), [facility]  . DG Hip Unilat With Pelvis 2-3 Views Left  . DG Chest 1 View  . Basic metabolic panel  . CBC WITH DIFFERENTIAL  . Protime-INR  . NPO Except for Meds  . Saline Lock IV, Maintain IV access  . Check CMS  . Bed rest  . Initiate Carrier Fluid Protocol  . Cardiac monitoring  . Consult to hospitalist  ALL PATIENTS BEING ADMITTED/HAVING PROCEDURES NEED COVID-19 SCREENING  . ED EKG  . EKG 12-Lead  . Type and screen [facility]  . ABO/Rh  . Admit to Inpatient (patient's expected length of stay will be greater than 2 midnights or inpatient only procedure)     Following Medications were ordered in ER: Medications  fentaNYL (SUBLIMAZE) injection 50 mcg (50 mcg Intravenous Given 02/12/19  1940)  fentaNYL (SUBLIMAZE) 100 MCG/2ML injection (100 mcg  Given 02/12/19 2106)        Consult Orders  (From admission, onward)         Start     Ordered   02/12/19 1824  Consult to hospitalist  ALL PATIENTS BEING ADMITTED/HAVING PROCEDURES NEED COVID-19 SCREENING  Once    Comments: ALL PATIENTS BEING ADMITTED/HAVING PROCEDURES NEED COVID-19 SCREENING  Provider:  (Not yet assigned)  Question Answer Comment  Place call to: [facility]   Reason for Consult Admit      02/12/19 1823          ER Provider Called:   Orthopedics  Dr.Xu  They [facility] Will see in AM    Significant initial  Findings: Abnormal Labs Reviewed  BASIC  METABOLIC PANEL - Abnormal; Notable for the following components:      Result Value   Sodium 134 (*)    CO2 18 (*)    Glucose, Bld 118 (*)    BUN 28 (*)    Calcium 8.5 (*)    All other components within normal limits     Otherwise labs showing:    Recent Labs  Lab 02/12/19 1720  NA 134*  K 3.8  CO2 18*  GLUCOSE 118*  BUN 28*  CREATININE 0.82  CALCIUM 8.5*    Cr   stable,    Lab Results  Component Value Date   CREATININE 0.82 02/12/2019   CREATININE 0.89 03/05/2018   CREATININE 0.87 01/31/2018    No results for input(s): AST, ALT, ALKPHOS, BILITOT, PROT, ALBUMIN in the last 168 hours. Lab Results  Component Value Date   CALCIUM 8.5 (L) 02/12/2019     WBC      Component Value Date/Time   WBC 10.0 02/12/2019 1720   ANC    Component Value Date/Time   NEUTROABS 7.6 02/12/2019 1720   NEUTROABS 4.9 01/31/2018 1035   ALC No results found for: LYMPHOABS    Plt: Lab Results  Component Value Date   PLT 269 02/12/2019     COVID-19 Labs    Lab Results  Component Value Date   SARSCOV2NAA NEGATIVE 02/12/2019     HG/HCT   stable,      Component Value Date/Time   HGB 15.0 02/12/2019 1720   HGB 16.0 01/31/2018 1035   HCT 44.9 02/12/2019 1720   HCT 47.9 01/31/2018 1035    ProBNP (last 3 results) Recent Labs    01/03/19 1710  PROBNP 944*     UA not ordered   CXR -  NON acute  LEft hip - intertrochanteric left femur fracture  ECG:  Personally reviewed by me showing: HR : 67  Rhythm:  NSR   no evidence of ischemic changes QTC 452     ED Triage Vitals  Enc Vitals Group     BP 02/12/19 1622 140/79     Pulse Rate 02/12/19 1622 73     Resp 02/12/19 1622 19     Temp 02/12/19 1622 98 F (36.7 C)     Temp Source 02/12/19 1622 Oral     SpO2 02/12/19 1622 98 %     Weight --      Height --      Head Circumference --      Peak Flow --      Pain Score 02/12/19 1634 10     Pain Loc --      Pain Edu? --  Excl. in GC? --   TMAX(24)@        Latest  Blood pressure 135/79, pulse 71, temperature 98 F (36.7 C), temperature source Oral, resp. rate 18, SpO2 97 %.     Hospitalist was called for admission for Left Hip fracture   Review of Systems:    Pertinent positives include: fall , dry cough  Constitutional:  No weight loss, night sweats, Fevers, chills, fatigue, weight loss  HEENT:  No headaches, Difficulty swallowing,Tooth/dental problems,Sore throat,  No sneezing, itching, ear ache, nasal congestion, post nasal drip,  Cardio-vascular:  No chest pain, Orthopnea, PND, anasarca, dizziness, palpitations.no Bilateral lower extremity swelling  GI:  No heartburn, indigestion, abdominal pain, nausea, vomiting, diarrhea, change in bowel habits, loss of appetite, melena, blood in stool, hematemesis Resp:  no shortness of breath at rest. No dyspnea on exertion, No excess mucus, no productive cough, No non-productive cough, No coughing up of blood.No change in color of mucus.No wheezing. Skin:  no rash or lesions. No jaundice GU:  no dysuria, change in color of urine, no urgency or frequency. No straining to urinate.  No flank pain.  Musculoskeletal:  No joint pain or no joint swelling. No decreased range of motion. No back pain.  Psych:  No change in mood or affect. No depression or anxiety. No memory loss.  Neuro: no localizing neurological complaints, no tingling, no weakness, no double vision, no gait abnormality, no slurred speech, no confusion  All systems reviewed and apart from [place] all are negative  Past Medical History:   Past Medical History:  Diagnosis Date  . Adhesive capsulitis of shoulder 12/20/2009   Qualifier: Diagnosis of  By: Arnoldo Morale MD, Balinda Quails   . Anxiety 06/13/2011  . Atrial flutter ([place]) 03/06/2017  . CHF (congestive heart failure) ([place]) 01/07/2018  . CONDUCTIVE HEARING LOSS BILATERAL 06/16/2008   Qualifier: Diagnosis of  By: Arnoldo Morale MD, John E   . Dilated cardiomyopathy ([place]) 03/06/2017  . Fatigue  05/17/2017  . Hypertension   . HYPERTENSION 03/12/2007   Qualifier: Diagnosis of  By: Jimmye Norman, LPN, Winfield Cunas   . HYPERTROPHY PROSTATE W/UR OBST & OTH LUTS 04/28/2009   Qualifier: Diagnosis of  By: Arnoldo Morale MD, Balinda Quails ING HERN W/OBST W/O MENTION GANGREN UNILAT/UNS 10/31/2007   Qualifier: Diagnosis of  By: Arnoldo Morale MD, Balinda Quails   . Weight loss 06/13/2011      Past Surgical History:  Procedure Laterality Date  . colonscopy  09/07/2006    Social History:  Ambulatory walker      reports that he has quit smoking. His smoking use included cigarettes. He has never used smokeless tobacco. He reports that he does not drink alcohol or use drugs.     Family History:   Family History  Problem Relation Age of Onset  . Aneurysm Other   . Stroke Father   . Anxiety disorder Sister   . CAD Neg Hx   . Sudden Cardiac Death Neg Hx     Allergies: No Known Allergies   Prior to Admission medications   Medication Sig Start Date End Date Taking? Authorizing Provider  acetaminophen (TYLENOL) 500 MG tablet Take 500 mg by mouth every 6 (six) hours as needed.   Yes [provider]  aspirin EC 81 MG tablet Take 1 tablet (81 mg total) by mouth daily. 03/05/18  Yes Richardo Priest, MD  citalopram (CELEXA) 10 MG tablet Take 20 mg by mouth daily.  01/07/18  Yes [provider]  methimazole (TAPAZOLE) 10 MG tablet Take 1.5 tablets (15 mg total) by mouth daily. 01/20/19  Yes Shamleffer, Konrad DoloresIbtehal Jaralla, MD  metoprolol succinate (TOPROL-XL) 25 MG 24 hr tablet Take 25 mg by mouth daily. 04/13/16 01/02/22 Yes [provider]  LORazepam (ATIVAN) 0.5 MG tablet TAKE 1 TABLET BY MOUTH TWICE DAILY AS NEEDED Patient taking differently: Take 0.5 mg by mouth 2 (two) times daily as needed for anxiety.  09/22/14   Eulis FosterWebb, Padonda B, FNP   Physical Exam: Blood pressure 135/79, pulse 71, temperature 98 F (36.7 C), temperature source Oral, resp. rate 18, SpO2 97 %. 1. General:  in No Acute distress    Chronically ill  -appearing 2. Psychological: Alert and   Oriented 3. Head/ENT:   Dry Mucous Membranes                          Head Non traumatic, neck supple                           Poor Dentition 4. SKIN:  decreased Skin turgor,  Skin clean Dry and intact no rash 5. Heart: Regular rate and rhythm no  Murmur, no Rub or gallop 6. Lungs:  no wheezes or crackles   7. Abdomen: Soft,  non-tender, Non distended bowel sounds present 8. Lower extremities: no clubbing, cyanosis, no  edema 9. Neurologically Grossly intact, moving all 4 extremities equally   10. MSK: Normal range of motion limited in left hip   All other LABS:     Recent Labs  Lab 02/12/19 1720  WBC 10.0  NEUTROABS 7.6  HGB 15.0  HCT 44.9  MCV 88.7  PLT 269     Recent Labs  Lab 02/12/19 1720  NA 134*  K 3.8  CL 101  CO2 18*  GLUCOSE 118*  BUN 28*  CREATININE 0.82  CALCIUM 8.5*     No results for input(s): AST, ALT, ALKPHOS, BILITOT, PROT, ALBUMIN in the last 168 hours.     Cultures: No results found for: SDES, SPECREQUEST, CULT, REPTSTATUS   Radiological Exams on Admission: Dg Chest 1 View  Result Date: 02/12/2019 CLINICAL DATA:  Fall EXAM: CHEST  1 VIEW COMPARISON:  Radiograph 02/04/2018, CT chest 01/11/2016 FINDINGS: The heart size and mediastinal contours are within normal limits. Both lungs are clear. The visualized soft tissues and osseous structures are unremarkable. IMPRESSION: No active disease. Electronically Signed   By: MD Kreg ShropshirePrice  DeHay   On: 02/12/2019 17:57   Dg Hip Unilat With Pelvis 2-3 Views Left  Result Date: 02/12/2019 CLINICAL DATA:  Fall, left hip pain, shortening of the left leg EXAM: DG HIP (WITH OR WITHOUT PELVIS) 2-3V LEFT COMPARISON:  None. FINDINGS: Highly comminuted intertrochanteric left femur fracture with marked varus angulation and foreshortening across the fracture line medial displacement of the distal fracture fragment by approximately 1 shaft width. Remaining bones of the  pelvis are congruent. Femoral heads remain normally located. Degenerative changes present in lumbar spine. There is significant soft tissue swelling of the left hip with effusion. Remaining soft tissues are unremarkable. IMPRESSION: Comminuted, displaced and foreshortened, varus angulated intertrochanteric left femur fracture. Electronically Signed   By: MD Kreg ShropshirePrice  DeHay   On: 02/12/2019 17:55    Chart has been reviewed    Assessment/Plan  84 y.o. male with medical history significant of Atrial Flutter, HTN and dilated cardiomyopathy, amiodarone induced Hyperthyroidism, very hard of hearing,  mild dementia    Admitted for Left hip fracture  Present on Admission: . Hip fracture (HCC) -  - management as per orthopedics,  plan to operate   in  a.m.    Keep nothing by mouth post midnight. Patient  not on anticoagulation aspirin on hold Ordered type and screen, Place Foley, order a vitamin D level  Patient at baseline  unable to walk a flight of stairs or 100 feet   due to  generalize fatigue and deconditioning    Patient denies any chest pain or shortness of breath currently  ,   ECG showing no evidence of acute ischemia    Given advanced age patient is at least moderate  Risk  which has been discussed with family but at this point no furthther cardiac workup is indicated. Had recent echo        . CONDUCTIVE HEARING LOSS BILATERAL - chronic . Atrial flutter (HCC) -           - CHA2DS2 vas score 4 :   Not on anticoagulation secondary to Risk of Falls,         -  Rate control:  Currently controlled with  Toprolol,  Change to IV Metoprolol, while NPO monitor on tele        - Rhythm control:did not tolerate  amiodarone  . Dilated cardiomyopathy (HCC) - chronic stable latest ECHo showed improvement  . Hyperthyroidism - check TSH t3 t4 and continue home meds  . Essential hypertension - cont metoprolol    Other plan as per orders.  DVT prophylaxis:  SCD      Code Status:   DNR/DNI   as per patient  I had personally discussed CODE STATUS with  Family     Family Communication:   Family not at  Bedside  plan of care was discussed on the phone with  Son   Disposition Plan:    likely will need placement for rehabilitation                                           Would benefit from PT/OT eval prior to DC                     Swallow eval - SLP ordered                   Social Work  consulted                   Nutrition    consulted                    Consults called: Orthopedics Dr.Xu   Admission status:  ED Disposition    ED Disposition Condition Comment   Admit  Hospital Area: [facility] [[id number]]  Level of Care: Telemetry Medical [104]  Covid Evaluation: Confirmed COVID Negative  Diagnosis: Hip fracture [facility]) [[id number]]  Admitting Physician: Therisa Doyne [[id number]]  Attending Physician: Therisa Doyne [[id number]]  Estimated length of stay: 3 - 4 days  Certification:: I certify this patient will need inpatient services for at least 2 midnights  PT Class (Do Not Modify): Inpatient [101]  PT Acc Code (Do Not Modify): Private [1]         inpatient     Expect 2 midnight stay secondary to severity of patient's current  illness including      Severe lab/radiological/exam abnormalities including:  Left hip FRX   and extensive comorbidities including:   * CHF *dementia   That are currently affecting medical management.   I expect  patient to be hospitalized for 2 midnights requiring inpatient medical care.  Patient is at high risk for adverse outcome (such as loss of life or disability) if not treated.  Indication for inpatient stay as follows:      severe pain requiring acute inpatient management,  inability to maintain oral hydration    Need for operative/procedural  intervention    Need for  IV fluids, IV rate controling medications  IV pain medications       Level of care    tele  For  24H   Precautions: NONE   No active  isolations  PPE: Used by the provider:   P100  eye Goggles,  Gloves      Neri Samek 02/12/2019, 9:43 PM    [facility]     after 2 AM please page floor coverage PA If 7AM-7PM, please contact the day team taking care of the patient using [email or web address]

## 2019-02-12 NOTE — Progress Notes (Signed)
I am aware of the patient and have spoken to his son, Suleiman Finigan who is HCPOA.  I have discussed surgical stabilization with him and obtained informed consent over the phone.  He can be reached at [phone].  Surgery is posted for Thursday afternoon pending medical/cardiac optimization.  Will see patient in the morning for full consult.  Azucena Cecil, MD [facility] [phone] 7:10 PM

## 2019-02-12 NOTE — ED Provider Notes (Signed)
[facility]-EMERGENCY DEPT Provider Note   CSN: [id number] Arrival date & time: 02/12/19  1610     History   Chief Complaint Chief Complaint  Patient presents with   Hip Pain   Hip Injury    HPI Bryan Paul is a 84 y.o. male.  History is limited as the patient is very hard of hearing.  Per EMS he had an unwitnessed fall at home and was found by family lying on the floor.  He is complaining of hip and groin pain mostly on the left.  He denies any other complaints.  No loss of consciousness.     The history is provided by the patient and the EMS personnel.  Hip Pain This is a new problem. The current episode started 1 to 2 hours ago. The problem occurs constantly. The problem has not changed since onset.Pertinent negatives include no chest pain, no abdominal pain, no headaches and no shortness of breath. The symptoms are aggravated by bending. The symptoms are relieved by position. He has tried nothing for the symptoms. The treatment provided no relief.    Past Medical History:  Diagnosis Date   Adhesive capsulitis of shoulder 12/20/2009   Qualifier: Diagnosis of  By: Lovell Sheehan MD, John Bea Laura    Anxiety 06/13/2011   Atrial flutter (HCC) 03/06/2017   CHF (congestive heart failure) (HCC) 01/07/2018   CONDUCTIVE HEARING LOSS BILATERAL 06/16/2008   Qualifier: Diagnosis of  By: Lovell Sheehan MD, John E    Dilated cardiomyopathy (HCC) 03/06/2017   Fatigue 05/17/2017   Hypertension    HYPERTENSION 03/12/2007   Qualifier: Diagnosis of  By: Mayford Knife, LPN, Domenic Polite    HYPERTROPHY PROSTATE W/UR OBST & OTH LUTS 04/28/2009   Qualifier: Diagnosis of  By: Lovell Sheehan MD, Balinda Quails    ING HERN W/OBST W/O MENTION GANGREN UNILAT/UNS 10/31/2007   Qualifier: Diagnosis of  By: Lovell Sheehan MD, Balinda Quails    Weight loss 06/13/2011    Patient Active Problem List   Diagnosis Date Noted   Hyperthyroidism 02/03/2019   On amiodarone therapy 01/03/2019   Dizzy 01/03/2019   CHF (congestive  heart failure) (HCC) 01/07/2018   Chronic anticoagulation 01/07/2018   Fatigue 05/17/2017   Atrial flutter (HCC) 03/06/2017   Dilated cardiomyopathy (HCC) 03/06/2017   Weight loss 06/13/2011   Anxiety 06/13/2011   ADHESIVE CAPSULITIS OF SHOULDER 12/20/2009   HYPERTROPHY PROSTATE W/UR OBST & OTH LUTS 04/28/2009   CONDUCTIVE HEARING LOSS BILATERAL 06/16/2008   ING HERN W/OBST W/O MENTION GANGREN UNILAT/UNS 10/31/2007   Hypertensive heart disease with heart failure (HCC) 03/12/2007    Past Surgical History:  Procedure Laterality Date   colonscopy  09/07/2006        Home Medications    Prior to Admission medications   Medication Sig Start Date End Date Taking? Authorizing Provider  acetaminophen (TYLENOL) 500 MG tablet Take 500 mg by mouth every 6 (six) hours as needed.    [provider]  aspirin EC 81 MG tablet Take 1 tablet (81 mg total) by mouth daily. 03/05/18   Baldo Daub, MD  citalopram (CELEXA) 10 MG tablet Take 20 mg by mouth daily.  01/07/18   [provider]  LORazepam (ATIVAN) 0.5 MG tablet TAKE 1 TABLET BY MOUTH TWICE DAILY AS NEEDED 09/22/14   Worthy Rancher B, FNP  methimazole (TAPAZOLE) 10 MG tablet Take 1.5 tablets (15 mg total) by mouth daily. 01/20/19   Shamleffer, Konrad Dolores, MD  metoprolol succinate (TOPROL-XL) 25 MG 24  hr tablet Take 25 mg by mouth daily. 04/13/16 01/02/22  [provider]    Family History Family History  Problem Relation Age of Onset   Aneurysm Other    Stroke Father    Anxiety disorder Sister    CAD Neg Hx    Sudden Cardiac Death Neg Hx     Social History Social History   Tobacco Use   Smoking status: Former Smoker    Types: Cigarettes   Smokeless tobacco: Never Used  Substance Use Topics   Alcohol use: No   Drug use: No     Allergies   Patient has no known allergies.   Review of Systems Review of Systems  Constitutional: Negative for fever.  HENT: Positive for hearing  loss. Negative for sore throat.   Eyes: Negative for visual disturbance.  Respiratory: Negative for shortness of breath.   Cardiovascular: Negative for chest pain.  Gastrointestinal: Negative for abdominal pain.  Genitourinary: Negative for dysuria.  Musculoskeletal: Negative for neck pain.  Skin: Negative for rash.  Neurological: Negative for headaches.     Physical Exam Updated Vital Signs BP 140/79 (BP Location: Right Arm)    Pulse 73    Temp 98 F (36.7 C) (Oral)    Resp 19    SpO2 98%   Physical Exam Vitals signs and nursing note reviewed.  Constitutional:      Appearance: He is well-developed.  HENT:     Head: Normocephalic and atraumatic.  Eyes:     Conjunctiva/sclera: Conjunctivae normal.  Neck:     Musculoskeletal: Neck supple.  Cardiovascular:     Rate and Rhythm: Normal rate and regular rhythm.     Heart sounds: No murmur.  Pulmonary:     Effort: Pulmonary effort is normal. No respiratory distress.     Breath sounds: Normal breath sounds.  Abdominal:     Palpations: Abdomen is soft.     Tenderness: There is no abdominal tenderness.  Musculoskeletal:        General: Tenderness, deformity and signs of injury present.     Right lower leg: No edema.     Left lower leg: No edema.     Comments: Patient is lying in his bed with his both of his hips flexed.  He is unable to range of motion either side without complaining of pain.  Upper extremities full range of motion without any limitations.  He is able to move his feet.  Sensation to light touch intact.  Skin:    General: Skin is warm and dry.     Capillary Refill: Capillary refill takes less than 2 seconds.  Neurological:     General: No focal deficit present.     Mental Status: He is alert and oriented to person, place, and time.     Sensory: No sensory deficit.      ED Treatments / Results  Labs (all labs ordered are listed, but only abnormal results are displayed) Labs Reviewed  BASIC METABOLIC PANEL  - Abnormal; Notable for the following components:      Result Value   Sodium 134 (*)    CO2 18 (*)    Glucose, Bld 118 (*)    BUN 28 (*)    Calcium 8.5 (*)    All other components within normal limits  SARS CORONAVIRUS 2 (HOSPITAL ORDER, [place] [facility])  CBC WITH DIFFERENTIAL/PLATELET  PROTIME-INR  TYPE AND SCREEN  ABO/RH    EKG EKG Interpretation  Date/Time:  Wednesday February 12 2019 16:20:51 EDT Ventricular Rate:  67 PR Interval:    QRS Duration: 95 QT Interval:  428 QTC Calculation: 452 R Axis:   83 Text Interpretation:  Sinus rhythm Borderline right axis deviation Low voltage, extremity leads Baseline wander in lead(s) II aVF no prior to compare with Confirmed by Meridee ScoreButler, Blossom Crume [phone]([id number]) on 02/12/2019 5:35:22 PM   Radiology Dg Chest 1 View  Result Date: 02/12/2019 CLINICAL DATA:  Fall EXAM: CHEST  1 VIEW COMPARISON:  Radiograph 02/04/2018, CT chest 01/11/2016 FINDINGS: The heart size and mediastinal contours are within normal limits. Both lungs are clear. The visualized soft tissues and osseous structures are unremarkable. IMPRESSION: No active disease. Electronically Signed   By: MD Kreg ShropshirePrice  DeHay   On: 02/12/2019 17:57   Dg Hip Unilat With Pelvis 2-3 Views Left  Result Date: 02/12/2019 CLINICAL DATA:  Fall, left hip pain, shortening of the left leg EXAM: DG HIP (WITH OR WITHOUT PELVIS) 2-3V LEFT COMPARISON:  None. FINDINGS: Highly comminuted intertrochanteric left femur fracture with marked varus angulation and foreshortening across the fracture line medial displacement of the distal fracture fragment by approximately 1 shaft width. Remaining bones of the pelvis are congruent. Femoral heads remain normally located. Degenerative changes present in lumbar spine. There is significant soft tissue swelling of the left hip with effusion. Remaining soft tissues are unremarkable. IMPRESSION: Comminuted, displaced and foreshortened, varus angulated intertrochanteric left  femur fracture. Electronically Signed   By: MD Kreg ShropshirePrice  DeHay   On: 02/12/2019 17:55    Procedures Procedures (including critical care time)  Medications Ordered in ED Medications  fentaNYL (SUBLIMAZE) injection 50 mcg (50 mcg Intravenous Given 02/12/19 1940)  fentaNYL (SUBLIMAZE) 100 MCG/2ML injection (100 mcg  Given 02/12/19 2106)     Initial Impression / Assessment and Plan / ED Course  I have reviewed the triage vital signs and the nursing notes.  Pertinent labs & imaging results that were available during my care of the patient were reviewed by me and considered in my medical decision making (see chart for details).  Clinical Course as of Feb 11 2133  Wed Feb 12, 2019  1759 I was able to reach out to the patient's son who said he could not been on the floor for more than 5 or 10 minutes and he had just gone out to get the laundry and came back and found him on the floor.  It sounds like he is been more forgetful and using his walker.  Patient's x-ray shows a left inotrope fracture and I updated the son on that.  They have already reached out to Dr. Meredith PelZou from orthopedics who called me and I have updated him on the patient's status.  He asked that medicine [facility] for probable surgery tomorrow.   [MB]  1800 Differential diagnosis includes fracture, dislocation, contusion   [MB]  1801 X-rays and EKG were independently reviewed by me.   [MB]  [id number] Discussed with Dr. Adela Glimpseoutova from the hospitalist service and they will evaluate the patient for [facility].   [MB]    Clinical Course User Index [MB] Terrilee FilesButler, Leon Montoya C, MD        Final Clinical Impressions(s) / ED Diagnoses   Final diagnoses:  Closed comminuted intertrochanteric fracture of left femur, initial encounter [facility](HCC)    ED Discharge Orders    None       Terrilee FilesButler, Kaspian Muccio C, MD 02/12/19 2135

## 2019-02-12 NOTE — Telephone Encounter (Signed)
Called the patient to discuss ultrasound results and the recommendations to proceed with right nodule biopsy.    His son answered the phone, son agreed to proceed with FNA but I explained to him , this decision has to come from the patient and this should be discussed with the patient .   Since the patient is very hard of hearing, will schedule an office visit to discuss options.     Son is in agreement.   He is also supposed to have labs scheduled in 2 weeks but this has not been done, will recheck on next visit    Abby Nena Jordan, MD  [facility]  [facility] [address] [place], [place] [address] Phone: [phone] FAX: [phone]

## 2019-02-12 NOTE — ED Notes (Signed)
Faint pulse with doppler. Bilaterally heard with doppler.

## 2019-02-12 NOTE — ED Notes (Signed)
Bed: WA02 Expected date:  Expected time:  Means of arrival:  Comments: EMS/fall/hip injury

## 2019-02-13 ENCOUNTER — Encounter (HOSPITAL_COMMUNITY): Payer: Self-pay | Admitting: Certified Registered Nurse Anesthetist

## 2019-02-13 ENCOUNTER — Inpatient Hospital Stay (HOSPITAL_COMMUNITY): Payer: PPO

## 2019-02-13 ENCOUNTER — Encounter (HOSPITAL_COMMUNITY)
Admission: EM | Disposition: A | Discharge status: Skilled Nursing Facility | Payer: Self-pay | Source: Home / Self Care | Attending: Internal Medicine

## 2019-02-13 ENCOUNTER — Inpatient Hospital Stay (HOSPITAL_COMMUNITY): Payer: PPO | Admitting: Certified Registered"

## 2019-02-13 DIAGNOSIS — S72142A Displaced intertrochanteric fracture of left femur, initial encounter for closed fracture: Secondary | ICD-10-CM

## 2019-02-13 DIAGNOSIS — E43 Unspecified severe protein-calorie malnutrition: Secondary | ICD-10-CM | POA: Insufficient documentation

## 2019-02-13 HISTORY — PX: INTRAMEDULLARY (IM) NAIL INTERTROCHANTERIC: SHX5875

## 2019-02-13 LAB — PREALBUMIN: Prealbumin: 16.7 mg/dL — ABNORMAL LOW (ref 18–38)

## 2019-02-13 LAB — TYPE AND SCREEN
ABO/RH(D): O NEG
Antibody Screen: NEGATIVE

## 2019-02-13 LAB — CBC
HCT: 43.3 % (ref 39.0–52.0)
Hemoglobin: 14.8 g/dL (ref 13.0–17.0)
MCH: 29.5 pg (ref 26.0–34.0)
MCHC: 34.2 g/dL (ref 30.0–36.0)
MCV: 86.3 fL (ref 80.0–100.0)
Platelets: 309 10*3/uL (ref 150–400)
RBC: 5.02 MIL/uL (ref 4.22–5.81)
RDW: 12.8 % (ref 11.5–15.5)
WBC: 10.3 10*3/uL (ref 4.0–10.5)
nRBC: 0 % (ref 0.0–0.2)

## 2019-02-13 LAB — BASIC METABOLIC PANEL
Anion gap: 10 (ref 5–15)
BUN: 25 mg/dL — ABNORMAL HIGH (ref 8–23)
CO2: 22 mmol/L (ref 22–32)
Calcium: 8.7 mg/dL — ABNORMAL LOW (ref 8.9–10.3)
Chloride: 101 mmol/L (ref 98–111)
Creatinine, Ser: 0.86 mg/dL (ref 0.61–1.24)
GFR calc Af Amer: >60 mL/min (ref >60)
GFR calc non Af Amer: >60 mL/min (ref >60)
Glucose, Bld: 132 mg/dL — ABNORMAL HIGH (ref 70–99)
Potassium: 4.1 mmol/L (ref 3.5–5.1)
Sodium: 133 mmol/L — ABNORMAL LOW (ref 135–145)

## 2019-02-13 LAB — TSH: TSH: 0.017 u[IU]/mL — ABNORMAL LOW (ref 0.350–4.500)

## 2019-02-13 LAB — SURGICAL PCR SCREEN
MRSA, PCR: POSITIVE — AB
Staphylococcus aureus: POSITIVE — AB

## 2019-02-13 LAB — T4, FREE: Free T4: 3.06 ng/dL — ABNORMAL HIGH (ref 0.61–1.12)

## 2019-02-13 LAB — ABO/RH: ABO/RH(D): O NEG

## 2019-02-13 SURGERY — FIXATION, FRACTURE, INTERTROCHANTERIC, WITH INTRAMEDULLARY ROD
Anesthesia: General | Site: Leg Upper | Laterality: Left

## 2019-02-13 MED ORDER — DEXAMETHASONE SODIUM PHOSPHATE 10 MG/ML IJ SOLN
INTRAMUSCULAR | Status: DC | PRN
  Administered 2019-02-13: 5 mg via INTRAVENOUS

## 2019-02-13 MED ORDER — SODIUM CHLORIDE 0.9 % IV SOLN
INTRAVENOUS | Status: DC
  Administered 2019-02-13 – 2019-02-16 (×3): via INTRAVENOUS

## 2019-02-13 MED ORDER — FENTANYL CITRATE (PF) 100 MCG/2ML IJ SOLN
25.0000 ug | INTRAMUSCULAR | Status: DC | PRN
  Administered 2019-02-13: 25 ug via INTRAVENOUS

## 2019-02-13 MED ORDER — PHENYLEPHRINE 40 MCG/ML (10ML) SYRINGE FOR IV PUSH (FOR BLOOD PRESSURE SUPPORT)
PREFILLED_SYRINGE | INTRAVENOUS | Status: AC
  Filled 2019-02-13: qty 10

## 2019-02-13 MED ORDER — METHOCARBAMOL 500 MG PO TABS: 500.0000 mg | ORAL_TABLET | Freq: Four times a day (QID) | ORAL | Status: DC | PRN

## 2019-02-13 MED ORDER — ONDANSETRON HCL 4 MG/2ML IJ SOLN: 4.0000 mg | Freq: Once | INTRAMUSCULAR | Status: DC | PRN

## 2019-02-13 MED ORDER — FENTANYL CITRATE (PF) 100 MCG/2ML IJ SOLN
INTRAMUSCULAR | Status: DC | PRN
  Administered 2019-02-13: 50 ug via INTRAVENOUS

## 2019-02-13 MED ORDER — PHENYLEPHRINE HCL (PRESSORS) 10 MG/ML IV SOLN
INTRAVENOUS | Status: DC | PRN
  Administered 2019-02-13: 120 ug via INTRAVENOUS
  Administered 2019-02-13: 80 ug via INTRAVENOUS
  Administered 2019-02-13: 120 ug via INTRAVENOUS

## 2019-02-13 MED ORDER — MENTHOL 3 MG MT LOZG: 1.0000 | LOZENGE | OROMUCOSAL | Status: DC | PRN

## 2019-02-13 MED ORDER — ALUM & MAG HYDROXIDE-SIMETH 200-200-20 MG/5ML PO SUSP: 30.0000 mL | ORAL | Status: DC | PRN

## 2019-02-13 MED ORDER — MORPHINE SULFATE (PF) 2 MG/ML IV SOLN: 1.0000 mg | INTRAVENOUS | Status: DC | PRN

## 2019-02-13 MED ORDER — ONDANSETRON HCL 4 MG/2ML IJ SOLN
INTRAMUSCULAR | Status: DC | PRN
  Administered 2019-02-13: 4 mg via INTRAVENOUS

## 2019-02-13 MED ORDER — LIDOCAINE 2% (20 MG/ML) 5 ML SYRINGE
INTRAMUSCULAR | Status: DC | PRN
  Administered 2019-02-13: 80 mg via INTRAVENOUS

## 2019-02-13 MED ORDER — ONDANSETRON HCL 4 MG/2ML IJ SOLN: 4.0000 mg | Freq: Four times a day (QID) | INTRAMUSCULAR | Status: DC | PRN

## 2019-02-13 MED ORDER — DOCUSATE SODIUM 100 MG PO CAPS
100.0000 mg | ORAL_CAPSULE | Freq: Two times a day (BID) | ORAL | Status: DC
  Administered 2019-02-13 – 2019-02-21 (×16): 100 mg via ORAL
  Filled 2019-02-13 (×16): qty 1

## 2019-02-13 MED ORDER — PROPOFOL 10 MG/ML IV BOLUS
INTRAVENOUS | Status: AC
  Filled 2019-02-13: qty 20

## 2019-02-13 MED ORDER — ROCURONIUM BROMIDE 10 MG/ML (PF) SYRINGE
PREFILLED_SYRINGE | INTRAVENOUS | Status: AC
  Filled 2019-02-13: qty 10

## 2019-02-13 MED ORDER — LIP MEDEX EX OINT
1.0000 "application " | TOPICAL_OINTMENT | CUTANEOUS | Status: DC | PRN
  Filled 2019-02-13: qty 7

## 2019-02-13 MED ORDER — PROPOFOL 10 MG/ML IV BOLUS
INTRAVENOUS | Status: DC | PRN
  Administered 2019-02-13: 100 mg via INTRAVENOUS

## 2019-02-13 MED ORDER — SUCCINYLCHOLINE CHLORIDE 20 MG/ML IJ SOLN
INTRAMUSCULAR | Status: DC | PRN
  Administered 2019-02-13: 100 mg via INTRAVENOUS

## 2019-02-13 MED ORDER — ACETAMINOPHEN 325 MG PO TABS
325.0000 mg | ORAL_TABLET | Freq: Four times a day (QID) | ORAL | Status: DC | PRN
  Administered 2019-02-16 – 2019-02-21 (×3): 650 mg via ORAL
  Filled 2019-02-13 (×3): qty 2

## 2019-02-13 MED ORDER — ENOXAPARIN SODIUM 40 MG/0.4ML ~~LOC~~ SOLN
40.0000 mg | SUBCUTANEOUS | Status: DC
  Administered 2019-02-14 – 2019-02-21 (×8): 40 mg via SUBCUTANEOUS
  Filled 2019-02-13 (×8): qty 0.4

## 2019-02-13 MED ORDER — PHENOL 1.4 % MT LIQD: 1.0000 | OROMUCOSAL | Status: DC | PRN

## 2019-02-13 MED ORDER — IPRATROPIUM-ALBUTEROL 0.5-2.5 (3) MG/3ML IN SOLN: 3.0000 mL | RESPIRATORY_TRACT | Status: DC | PRN

## 2019-02-13 MED ORDER — MAGNESIUM CITRATE PO SOLN
1.0000 | Freq: Once | ORAL | Status: AC | PRN
  Administered 2019-02-18: 1 via ORAL
  Filled 2019-02-13: qty 296

## 2019-02-13 MED ORDER — LACTATED RINGERS IV SOLN
INTRAVENOUS | Status: DC
  Administered 2019-02-13: 14:00:00 via INTRAVENOUS

## 2019-02-13 MED ORDER — FENTANYL CITRATE (PF) 100 MCG/2ML IJ SOLN
INTRAMUSCULAR | Status: AC
  Filled 2019-02-13: qty 2

## 2019-02-13 MED ORDER — FENTANYL CITRATE (PF) 250 MCG/5ML IJ SOLN
INTRAMUSCULAR | Status: AC
  Filled 2019-02-13: qty 5

## 2019-02-13 MED ORDER — ONDANSETRON HCL 4 MG/2ML IJ SOLN
INTRAMUSCULAR | Status: AC
  Filled 2019-02-13: qty 2

## 2019-02-13 MED ORDER — POLYVINYL ALCOHOL 1.4 % OP SOLN
1.0000 [drp] | OPHTHALMIC | Status: DC | PRN
  Filled 2019-02-13: qty 15

## 2019-02-13 MED ORDER — HYDRALAZINE HCL 20 MG/ML IJ SOLN: 10.0000 mg | INTRAMUSCULAR | Status: DC | PRN

## 2019-02-13 MED ORDER — HYDROCORTISONE 1 % EX CREA
1.0000 "application " | TOPICAL_CREAM | Freq: Three times a day (TID) | CUTANEOUS | Status: DC | PRN
  Filled 2019-02-13: qty 28

## 2019-02-13 MED ORDER — GUAIFENESIN-DM 100-10 MG/5ML PO SYRP: 5.0000 mL | ORAL_SOLUTION | ORAL | Status: DC | PRN

## 2019-02-13 MED ORDER — POLYETHYLENE GLYCOL 3350 17 G PO PACK: 17.0000 g | PACK | Freq: Every day | ORAL | Status: DC | PRN

## 2019-02-13 MED ORDER — ACETAMINOPHEN 500 MG PO TABS: 1000.0000 mg | ORAL_TABLET | Freq: Once | ORAL | Status: DC

## 2019-02-13 MED ORDER — METHOCARBAMOL 1000 MG/10ML IJ SOLN
500.0000 mg | Freq: Four times a day (QID) | INTRAVENOUS | Status: DC | PRN
  Filled 2019-02-13: qty 5

## 2019-02-13 MED ORDER — ACETAMINOPHEN 500 MG PO TABS
500.0000 mg | ORAL_TABLET | Freq: Four times a day (QID) | ORAL | Status: AC
  Administered 2019-02-13 – 2019-02-14 (×3): 500 mg via ORAL
  Filled 2019-02-13 (×4): qty 1

## 2019-02-13 MED ORDER — DEXAMETHASONE SODIUM PHOSPHATE 10 MG/ML IJ SOLN
INTRAMUSCULAR | Status: AC
  Filled 2019-02-13: qty 1

## 2019-02-13 MED ORDER — ONDANSETRON HCL 4 MG PO TABS: 4.0000 mg | ORAL_TABLET | Freq: Four times a day (QID) | ORAL | Status: DC | PRN

## 2019-02-13 MED ORDER — 0.9 % SODIUM CHLORIDE (POUR BTL) OPTIME
TOPICAL | Status: DC | PRN
  Administered 2019-02-13: 1000 mL

## 2019-02-13 MED ORDER — HYDROCORTISONE (PERIANAL) 2.5 % EX CREA
1.0000 "application " | TOPICAL_CREAM | Freq: Four times a day (QID) | CUTANEOUS | Status: DC | PRN
  Filled 2019-02-13: qty 28.35

## 2019-02-13 MED ORDER — SENNOSIDES-DOCUSATE SODIUM 8.6-50 MG PO TABS: 2.0000 | ORAL_TABLET | Freq: Every evening | ORAL | Status: DC | PRN

## 2019-02-13 MED ORDER — SUGAMMADEX SODIUM 200 MG/2ML IV SOLN
INTRAVENOUS | Status: DC | PRN
  Administered 2019-02-13: 200 mg via INTRAVENOUS

## 2019-02-13 MED ORDER — MUSCLE RUB 10-15 % EX CREA
1.0000 "application " | TOPICAL_CREAM | CUTANEOUS | Status: DC | PRN
  Filled 2019-02-13: qty 85

## 2019-02-13 MED ORDER — HYDROCODONE-ACETAMINOPHEN 7.5-325 MG PO TABS: 1.0000 | ORAL_TABLET | ORAL | Status: DC | PRN

## 2019-02-13 MED ORDER — MUPIROCIN 2 % EX OINT
1.0000 "application " | TOPICAL_OINTMENT | Freq: Two times a day (BID) | CUTANEOUS | Status: AC
  Administered 2019-02-13 – 2019-02-17 (×10): 1 via NASAL
  Filled 2019-02-13 (×3): qty 22

## 2019-02-13 MED ORDER — HYDROCODONE-ACETAMINOPHEN 7.5-325 MG PO TABS: 1.0000 | ORAL_TABLET | Freq: Three times a day (TID) | ORAL | 0 refills | Status: AC | PRN

## 2019-02-13 MED ORDER — SUCCINYLCHOLINE CHLORIDE 200 MG/10ML IV SOSY
PREFILLED_SYRINGE | INTRAVENOUS | Status: AC
  Filled 2019-02-13: qty 10

## 2019-02-13 MED ORDER — LIDOCAINE 2% (20 MG/ML) 5 ML SYRINGE
INTRAMUSCULAR | Status: AC
  Filled 2019-02-13: qty 5

## 2019-02-13 MED ORDER — STERILE WATER FOR IRRIGATION IR SOLN
Status: DC | PRN
  Administered 2019-02-13: 1000 mL

## 2019-02-13 MED ORDER — SORBITOL 70 % SOLN: 30.0000 mL | Freq: Every day | Status: DC | PRN

## 2019-02-13 MED ORDER — HYDROCODONE-ACETAMINOPHEN 5-325 MG PO TABS
1.0000 | ORAL_TABLET | ORAL | Status: DC | PRN
  Administered 2019-02-15 (×2): 2 via ORAL
  Administered 2019-02-16: 1 via ORAL
  Filled 2019-02-13: qty 2
  Filled 2019-02-13: qty 1
  Filled 2019-02-13: qty 2

## 2019-02-13 MED ORDER — ENOXAPARIN SODIUM 40 MG/0.4ML ~~LOC~~ SOLN: 40.0000 mg | Freq: Every day | SUBCUTANEOUS | 13 refills | Status: AC

## 2019-02-13 MED ORDER — ROCURONIUM BROMIDE 50 MG/5ML IV SOSY
PREFILLED_SYRINGE | INTRAVENOUS | Status: DC | PRN
  Administered 2019-02-13: 20 mg via INTRAVENOUS
  Administered 2019-02-13: 30 mg via INTRAVENOUS

## 2019-02-13 MED ORDER — LORATADINE 10 MG PO TABS: 10.0000 mg | ORAL_TABLET | Freq: Every day | ORAL | Status: DC | PRN

## 2019-02-13 MED ORDER — SALINE SPRAY 0.65 % NA SOLN
1.0000 | NASAL | Status: DC | PRN
  Filled 2019-02-13: qty 44

## 2019-02-13 MED ORDER — CEFAZOLIN SODIUM-DEXTROSE 2-4 GM/100ML-% IV SOLN
2.0000 g | Freq: Four times a day (QID) | INTRAVENOUS | Status: AC
  Administered 2019-02-13 – 2019-02-14 (×3): 2 g via INTRAVENOUS
  Filled 2019-02-13 (×3): qty 100

## 2019-02-13 SURGICAL SUPPLY — 44 items
BIT DRILL SHORT 4.0 (BIT) IMPLANT
BNDG COHESIVE 4X5 TAN NS LF (GAUZE/BANDAGES/DRESSINGS) ×3 IMPLANT
BNDG COHESIVE 6X5 TAN STRL LF (GAUZE/BANDAGES/DRESSINGS) IMPLANT
BNDG GAUZE ELAST 4 BULKY (GAUZE/BANDAGES/DRESSINGS) ×3 IMPLANT
COVER PERINEAL POST (MISCELLANEOUS) ×3 IMPLANT
COVER SURGICAL LIGHT HANDLE (MISCELLANEOUS) ×3 IMPLANT
COVER WAND RF STERILE (DRAPES) ×3 IMPLANT
DRAPE C-ARMOR (DRAPES) ×2 IMPLANT
DRAPE STERI IOBAN 125X83 (DRAPES) ×3 IMPLANT
DRILL BIT SHORT 4.0 (BIT) ×2
DRSG MEPILEX BORDER 4X4 (GAUZE/BANDAGES/DRESSINGS) ×5 IMPLANT
DRSG MEPILEX BORDER 4X8 (GAUZE/BANDAGES/DRESSINGS) ×3 IMPLANT
DRSG PAD ABDOMINAL 8X10 ST (GAUZE/BANDAGES/DRESSINGS) ×6 IMPLANT
DURAPREP 26ML APPLICATOR (WOUND CARE) ×3 IMPLANT
ELECT REM PT RETURN 9FT ADLT (ELECTROSURGICAL) ×3
ELECTRODE REM PT RTRN 9FT ADLT (ELECTROSURGICAL) ×1 IMPLANT
GLOVE BIOGEL PI IND STRL 7.0 (GLOVE) ×1 IMPLANT
GLOVE BIOGEL PI INDICATOR 7.0 (GLOVE) ×2
GLOVE ECLIPSE 7.0 STRL STRAW (GLOVE) ×5 IMPLANT
GLOVE SKINSENSE NS SZ7.5 (GLOVE) ×4
GLOVE SKINSENSE STRL SZ7.5 (GLOVE) ×2 IMPLANT
GOWN STRL REIN XL XLG (GOWN DISPOSABLE) ×3 IMPLANT
GUIDE PIN 3.2X343 (PIN) ×2
GUIDE PIN 3.2X343MM (PIN) ×4
KIT BASIN OR (CUSTOM PROCEDURE TRAY) ×3 IMPLANT
KIT TURNOVER KIT B (KITS) ×3 IMPLANT
MANIFOLD NEPTUNE II (INSTRUMENTS) ×3 IMPLANT
NAIL TRIGEN LEFT 10X38-125 (Nail) ×2 IMPLANT
NS IRRIG 1000ML POUR BTL (IV SOLUTION) ×3 IMPLANT
PACK GENERAL/GYN (CUSTOM PROCEDURE TRAY) ×3 IMPLANT
PAD ARMBOARD 7.5X6 YLW CONV (MISCELLANEOUS) ×6 IMPLANT
PAD CAST 4YDX4 CTTN HI CHSV (CAST SUPPLIES) ×2 IMPLANT
PADDING CAST COTTON 4X4 STRL (CAST SUPPLIES) ×4
PIN GUIDE 3.2X343MM (PIN) IMPLANT
SCREW LAG COMPR KIT 95/90 (Screw) ×2 IMPLANT
SCREW TRIGEN LOW PROF 5.0X40 (Screw) ×2 IMPLANT
STAPLER VISISTAT 35W (STAPLE) ×5 IMPLANT
SUT VIC AB 0 CT1 27 (SUTURE) ×4
SUT VIC AB 0 CT1 27XBRD ANBCTR (SUTURE) ×1 IMPLANT
SUT VIC AB 2-0 CT1 27 (SUTURE) ×4
SUT VIC AB 2-0 CT1 TAPERPNT 27 (SUTURE) ×1 IMPLANT
TOWEL GREEN STERILE (TOWEL DISPOSABLE) ×3 IMPLANT
TOWEL GREEN STERILE FF (TOWEL DISPOSABLE) ×3 IMPLANT
WATER STERILE IRR 1000ML POUR (IV SOLUTION) ×3 IMPLANT

## 2019-02-13 NOTE — Progress Notes (Signed)
PROGRESS NOTE    Bryan ShelterJohnny Floyd Paul  GNF:[id number] DOB: [birth year] DOA: 02/12/2019 PCP: Marylen PontoHolt, Lynley S, MD   Brief Narrative:  84 year old with history of atrial flutter, dilated cardiomyopathy, essential hypertension, amiodarone induced hypothyroidism, difficulty hearing and mild dementia came to the hospital while patient had unwitnessed fall after patient's family went on an errand.  They found him on the floor.  He was brought to the hospital for further evaluation.  He was noted to have left intertrochanteric fracture.  Orthopedic team was consulted.   Assessment & Plan:   Principal Problem:   Closed comminuted intertrochanteric fracture of proximal end of left femur, initial encounter (HCC) Active Problems:   CONDUCTIVE HEARING LOSS BILATERAL   Atrial flutter (HCC)   Dilated cardiomyopathy (HCC)   CHF (congestive heart failure) (HCC)   Hyperthyroidism   Essential hypertension   Fall at home, initial encounter   Hip fracture [facility] [place] [facility](HCC)  Fall leading to left intertrochanteric fracture of his hip - Plans for surgical intervention this afternoon. - Hold off on anticoagulation, will resume after per surgical team discretion -PT/OT, pain control, bowel regimen -Incentive spirometer  Dilated cardiomyopathy - Echocardiogram from 01/30/2019 showed ejection fraction 60 to 65%. -Continue supportive care.  History of atrial flutter -Not on anticoagulation due to risk of falling.  On aspirin.  Status post cardioversion in June 2019.  Unable to tolerate amiodarone.  Currently on Toprol.  Bilateral conductive hearing loss, chronic -Supportive care.  Hyperthyroidism -Follow-up thyroid studies.  Continue home meds  Essential hypertension -On metoprolol.  DVT prophylaxis: None preoperatively, postop per surgical team Code Status: DNR Family Communication: Spoke with patient's son over the phone Disposition Plan: Maintain hospital stay for surgical intervention  Consultants:    Orthopedic  Procedures:   OR today  Antimicrobials:   Preop   Subjective: Patient is extremely hard of hearing.  But he is able to read when written on piece of paper, he denies any complaints and patiently waiting for surgery this afternoon.  Review of Systems Otherwise negative except as per HPI, including: General: Denies fever, chills, night sweats or unintended weight loss. Resp: Denies cough, wheezing, shortness of breath. Cardiac: Denies chest pain, palpitations, orthopnea, paroxysmal nocturnal dyspnea. GI: Denies abdominal pain, nausea, vomiting, diarrhea or constipation GU: Denies dysuria, frequency, hesitancy or incontinence MS: Denies muscle aches, joint pain or swelling Neuro: Denies headache, neurologic deficits (focal weakness, numbness, tingling), abnormal gait Psych: Denies anxiety, depression, SI/HI/AVH Skin: Denies new rashes or lesions ID: Denies sick contacts, exotic exposures, travel  Objective: Vitals:   02/12/19 2049 02/12/19 2146 02/13/19 0442 02/13/19 0822  BP: (!) 148/87 139/75 (!) 141/81 128/76  Pulse: 80 74 86 80  Resp: (!) 22 16 12 18   Temp:  (!) 97.5 F (36.4 C) 98.4 F (36.9 C) 98.2 F (36.8 C)  TempSrc:  Oral Oral Oral  SpO2: 99% 98% 96% 97%    Intake/Output Summary (Last 24 hours) at 02/13/2019 1114 Last data filed at 02/13/2019 0900 Gross per 24 hour  Intake 0 ml  Output 250 ml  Net -250 ml   There were no vitals filed for this visit.  Examination:  General exam: Appears calm and comfortable, elderly frail-appearing.  Very hard of hearing. Respiratory system: Clear to auscultation. Respiratory effort normal. Cardiovascular system: S1 & S2 heard, RRR. No JVD, murmurs, rubs, gallops or clicks. No pedal edema. Gastrointestinal system: Abdomen is nondistended, soft and nontender. No organomegaly or masses felt. Normal bowel sounds heard. Central nervous system: Alert and  oriented. No focal neurological deficits. Extremities: Limited  range of motion of his lower extremity secondary to pain. Skin: No rashes, lesions or ulcers Psychiatry: Judgement and insight appear normal. Mood & affect appropriate.     Data Reviewed:   CBC: Recent Labs  Lab 02/12/19 1720 02/13/19 0241  WBC 10.0 10.3  NEUTROABS 7.6  --   HGB 15.0 14.8  HCT 44.9 43.3  MCV 88.7 86.3  PLT 269 062   Basic Metabolic Panel: Recent Labs  Lab 02/12/19 1720 02/13/19 0241  NA 134* 133*  K 3.8 4.1  CL 101 101  CO2 18* 22  GLUCOSE 118* 132*  BUN 28* 25*  CREATININE 0.82 0.86  CALCIUM 8.5* 8.7*   GFR: Estimated Creatinine Clearance: 57.6 mL/min (by C-G formula based on SCr of 0.86 mg/dL). Liver Function Tests: Recent Labs  Lab 02/12/19 2206  ALBUMIN 3.4*   No results for input(s): LIPASE, AMYLASE in the last 168 hours. No results for input(s): AMMONIA in the last 168 hours. Coagulation Profile: Recent Labs  Lab 02/12/19 1720  INR 1.1   Cardiac Enzymes: No results for input(s): CKTOTAL, CKMB, CKMBINDEX, TROPONINI in the last 168 hours. BNP (last 3 results) Recent Labs    01/03/19 1710  PROBNP 944*   HbA1C: No results for input(s): HGBA1C in the last 72 hours. CBG: No results for input(s): GLUCAP in the last 168 hours. Lipid Profile: No results for input(s): CHOL, HDL, LDLCALC, TRIG, CHOLHDL, LDLDIRECT in the last 72 hours. Thyroid Function Tests: Recent Labs    02/13/19 0241  TSH 0.017*  FREET4 3.06*   Anemia Panel: No results for input(s): VITAMINB12, FOLATE, FERRITIN, TIBC, IRON, RETICCTPCT in the last 72 hours. Sepsis Labs: No results for input(s): PROCALCITON, LATICACIDVEN in the last 168 hours.  Recent Results (from the past 240 hour(s))  SARS Coronavirus 2 (CEPHEID - Performed in [place] hospital lab), Hosp Order     Status: None   Collection Time: 02/12/19  5:20 PM   Specimen: Nasopharyngeal Swab  Result Value Ref Range Status   SARS Coronavirus 2 NEGATIVE NEGATIVE Final    Comment: (NOTE) If result  is NEGATIVE SARS-CoV-2 target nucleic acids are NOT DETECTED. The SARS-CoV-2 RNA is generally detectable in upper and lower  respiratory specimens during the acute phase of infection. The lowest  concentration of SARS-CoV-2 viral copies this assay can detect is 250  copies / mL. A negative result does not preclude SARS-CoV-2 infection  and should not be used as the sole basis for treatment or other  patient management decisions.  A negative result may occur with  improper specimen collection / handling, submission of specimen other  than nasopharyngeal swab, presence of viral mutation(s) within the  areas targeted by this assay, and inadequate number of viral copies  (<250 copies / mL). A negative result must be combined with clinical  observations, patient history, and epidemiological information. If result is POSITIVE SARS-CoV-2 target nucleic acids are DETECTED. The SARS-CoV-2 RNA is generally detectable in upper and lower  respiratory specimens dur ing the acute phase of infection.  Positive  results are indicative of active infection with SARS-CoV-2.  Clinical  correlation with patient history and other diagnostic information is  necessary to determine patient infection status.  Positive results do  not rule out bacterial infection or co-infection with other viruses. If result is PRESUMPTIVE POSTIVE SARS-CoV-2 nucleic acids MAY BE PRESENT.   A presumptive positive result was obtained on the submitted specimen  and confirmed  on repeat testing.  While 2019 novel coronavirus  (SARS-CoV-2) nucleic acids may be present in the submitted sample  additional confirmatory testing may be necessary for epidemiological  and / or clinical management purposes  to differentiate between  SARS-CoV-2 and other Sarbecovirus currently known to infect humans.  If clinically indicated additional testing with an alternate test  methodology [phone]) is advised. The SARS-CoV-2 RNA is generally   detectable in upper and lower respiratory sp ecimens during the acute  phase of infection. The expected result is Negative. Fact Sheet for Patients:  BoilerBrush.com.cy Fact Sheet for Healthcare Providers: https://pope.com/ This test is not yet approved or cleared by the [place] FDA and has been authorized for detection and/or diagnosis of SARS-CoV-2 by FDA under an Emergency Use Authorization (EUA).  This EUA will remain in effect (meaning this test can be used) for the duration of the COVID-19 declaration under Section 564(b)(1) of the Act, 21 U.S.C. section 360bbb-3(b)(1), unless the authorization is terminated or revoked sooner. Performed at [facility], [address], [place], [place] [address]   Surgical pcr screen     Status: Abnormal   Collection Time: 02/12/19 11:15 PM   Specimen: Nasal Mucosa; Nasal Swab  Result Value Ref Range Status   MRSA, PCR POSITIVE (A) NEGATIVE Final   Staphylococcus aureus POSITIVE (A) NEGATIVE Final    Comment: RESULT CALLED TO, READ BACK BY AND VERIFIED WITH: Corky Downs RN 02/13/19 0055 JDW (NOTE) The Xpert SA Assay (FDA approved for NASAL specimens in patients 62 years of age and older), is one component of a comprehensive surveillance program. It is not intended to diagnose infection nor to guide or monitor treatment. Performed at [facility], [address] [place], [place] [address]          Radiology Studies: Dg Chest 1 View  Result Date: 02/12/2019 CLINICAL DATA:  Fall EXAM: CHEST  1 VIEW COMPARISON:  Radiograph 02/04/2018, CT chest 01/11/2016 FINDINGS: The heart size and mediastinal contours are within normal limits. Both lungs are clear. The visualized soft tissues and osseous structures are unremarkable. IMPRESSION: No active disease. Electronically Signed   By: MD Kreg Shropshire   On: 02/12/2019 17:57   Dg Hip Unilat With Pelvis 2-3 Views Left   Result Date: 02/12/2019 CLINICAL DATA:  Fall, left hip pain, shortening of the left leg EXAM: DG HIP (WITH OR WITHOUT PELVIS) 2-3V LEFT COMPARISON:  None. FINDINGS: Highly comminuted intertrochanteric left femur fracture with marked varus angulation and foreshortening across the fracture line medial displacement of the distal fracture fragment by approximately 1 shaft width. Remaining bones of the pelvis are congruent. Femoral heads remain normally located. Degenerative changes present in lumbar spine. There is significant soft tissue swelling of the left hip with effusion. Remaining soft tissues are unremarkable. IMPRESSION: Comminuted, displaced and foreshortened, varus angulated intertrochanteric left femur fracture. Electronically Signed   By: MD Kreg Shropshire   On: 02/12/2019 17:55        Scheduled Meds: . Melene Muller ON 02/14/2019] citalopram  20 mg Oral Daily  . feeding supplement  296 mL Oral Once  . methimazole  15 mg Oral Daily  . metoprolol tartrate  2.5 mg Intravenous Q12H  . mupirocin ointment  1 application Nasal BID  . povidone-iodine  2 application Topical Once  . tranexamic acid (CYKLOKAPRON) topical -INTRAOP  2,000 mg Topical To OR   Continuous Infusions: .  ceFAZolin (ANCEF) IV    . methocarbamol (ROBAXIN) IV    .  tranexamic acid       LOS: 1 day   Time spent= 35 mins    Ankit Joline Maxcyhirag Amin, MD [facility]  If 7PM-7AM, please contact night-coverage [email or web address] 02/13/2019, 11:14 AM

## 2019-02-13 NOTE — Anesthesia Preprocedure Evaluation (Signed)
Anesthesia Evaluation  Patient identified by MRN, date of birth, ID band Patient awake    Reviewed: Allergy & Precautions, NPO status , Patient's Chart, lab work & pertinent test results, reviewed documented beta blocker date and time   Airway Mallampati: II  TM Distance: >3 FB Neck ROM: Full    Dental  (+) Teeth Intact, Dental Advisory Given   Pulmonary former smoker,    Pulmonary exam normal breath sounds clear to auscultation       Cardiovascular hypertension, Pt. on home beta blockers +CHF  Normal cardiovascular exam+ dysrhythmias Atrial Fibrillation  Rhythm:Regular Rate:Normal  Echo 01/30/2019:  1. The left ventricle has normal systolic function with an ejection fraction of 60-65%. The cavity size was normal. Left ventricular diastolic Doppler parameters are consistent with impaired relaxation.  2. The right ventricle has normal systolic function. The cavity was normal. There is no increase in right ventricular wall thickness.  3. The mitral valve is grossly normal.  4. The tricuspid valve is grossly normal.  5. The aortic valve is grossly normal. Aortic valve regurgitation is mild by color flow Doppler.   Neuro/Psych PSYCHIATRIC DISORDERS Anxiety negative neurological ROS     GI/Hepatic negative GI ROS, Neg liver ROS,   Endo/Other  Hyperthyroidism   Renal/GU negative Renal ROS     Musculoskeletal Femur Fracture   Abdominal   Peds  Hematology negative hematology ROS (+) Plt 309k   Anesthesia Other Findings Day of surgery medications reviewed with the patient.  Reproductive/Obstetrics                             Anesthesia Physical Anesthesia Plan  ASA: III  Anesthesia Plan: General   Post-op Pain Management:    Induction: Intravenous  PONV Risk Score and Plan: 2 and Dexamethasone and Ondansetron  Airway Management Planned: Oral ETT  Additional Equipment:   Intra-op Plan:    Post-operative Plan: Extubation in OR  Informed Consent: I have reviewed the patients History and Physical, chart, labs and discussed the procedure including the risks, benefits and alternatives for the proposed anesthesia with the patient or authorized representative who has indicated his/her understanding and acceptance.     Dental advisory given  Plan Discussed with: CRNA  Anesthesia Plan Comments:         Anesthesia Quick Evaluation

## 2019-02-13 NOTE — Anesthesia Procedure Notes (Signed)
Procedure Name: Intubation Date/Time: 02/13/2019 3:13 PM Performed by: Catalina Gravel, MD Pre-anesthesia Checklist: Patient identified, Emergency Drugs available, Suction available and Patient being monitored Patient Re-evaluated:Patient Re-evaluated prior to induction Oxygen Delivery Method: Circle System Utilized Preoxygenation: Pre-oxygenation with 100% oxygen Induction Type: IV induction and Rapid sequence Ventilation: Mask ventilation without difficulty Laryngoscope Size: Mac and 4 Grade View: Grade I Tube type: Oral Tube size: 7.5 mm Number of attempts: 1 Airway Equipment and Method: Stylet and Oral airway Placement Confirmation: ETT inserted through vocal cords under direct vision,  positive ETCO2 and breath sounds checked- equal and bilateral Secured at: 23 cm Tube secured with: Tape Dental Injury: Teeth and Oropharynx as per pre-operative assessment

## 2019-02-13 NOTE — Anesthesia Postprocedure Evaluation (Signed)
Anesthesia Post Note  Patient: Bryan Paul  Procedure(s) Performed: INTRAMEDULLARY (IM) NAIL INTERTROCHANTRIC, Left Femur (Left Leg Upper)     Patient location during evaluation: PACU Anesthesia Type: General Level of consciousness: sedated and patient cooperative Pain management: pain level controlled Vital Signs Assessment: post-procedure vital signs reviewed and stable Respiratory status: spontaneous breathing Cardiovascular status: stable Anesthetic complications: no    Last Vitals:  Vitals:   02/13/19 1713 02/13/19 1722  BP: (!) 176/73 140/73  Pulse: 72 72  Resp: 15 14  Temp:  (!) 36.4 C  SpO2: 98% 97%    Last Pain:  Vitals:   02/13/19 1722  TempSrc:   PainSc: 0-No pain                 Nolon Nations

## 2019-02-13 NOTE — Progress Notes (Signed)
SLP Cancellation Note  Patient Details Name: Latham Kinzler MRN: [id number] DOB: Mar 27, 1935   Cancelled treatment:       Reason Eval/Treat Not Completed: Patient at procedure or test/unavailable. Surgery planned for this afternoon. Will defer SLP eval until after surgery.    Baker Kogler, Katherene Ponto 02/13/2019, 8:27 AM

## 2019-02-13 NOTE — Progress Notes (Signed)
Initial Nutrition Assessment  DOCUMENTATION CODES:   Severe malnutrition in context of chronic illness  INTERVENTION:  Monitor for diet advancement and SLP recs  Recommend when appropriate: -Ensure Enlive po BID, each supplement provides 350 kcal and 20 grams of protein (likes all flavors) -Magic cup TID with meals, each supplement provides 290 kcal and 9 grams of protein (all flavors) -Prostat po BID, each supplement provides 100 kcal and 15 grams of protein  NUTRITION DIAGNOSIS:   Severe Malnutrition related to poor appetite, chronic illness as evidenced by percent weight loss, energy intake < or equal to 75% for > or equal to 1 month(per family report).   GOAL:   Patient will meet greater than or equal to 90% of their needs   MONITOR:   Weight trends, Diet advancement  REASON FOR ASSESSMENT:   Consult Assessment of nutrition requirement/status  ASSESSMENT:  84 year old male with past medical history significant of  HTN, CHF, atrial flutter, cardiomyopathy,mild dementia, and significant bilateral hearing loss who presents with left intertrochanteric fracture s/p mechanical fall at home.  Per chart review, pt lives with his son and family. The family returned home after a quick errand to find pt in his bedroom floor, unable to get up and complaining of severe left hip/groin pain. Pt is reported to have fallen 4 times in the past few months; he is supposed to use a walker, but often forgetful.    Spoke with son Xai Frerking) via phone who reports decreased appetite and 20lb wt loss over the past 3 months. Son stated that patient drinks 2 ensure daily and if he is lucky his dad will eat a sandwich or bowl of soup. Son recalls noticing decreased intake/ wt loss after initiation of thyroid medication.   Prior to new medication, pt is reported to have a good appetite; son recalls 2-3 meals/day and stated pt loved to snack on candy and denies any chewing/swallowing issues.  Son  recalls pt UBW of 165-170lbs approximately 4 months ago.  Weights reviewed - trending down over the past 12 months with concerning recent 11lb wt loss noted over the past month (6.97% - severe for time frame)  6/30: 146.7 lb 5/29: 157.7 lb   Suspect patient has severe malnutrition given wt loss and intake history obtained from son.   7/9: SLP Eval - deferred until after surgery  Medications and labs reviewed    NUTRITION - FOCUSED PHYSICAL EXAM:  Unable to access at this time  Diet Order:   Diet Order            Diet NPO time specified  Diet effective midnight              EDUCATION NEEDS:   Not appropriate for education at this time  Skin:  Skin Assessment: Reviewed RN Assessment  Last BM:  unknown  Height:   Ht Readings from Last 1 Encounters:  02/04/19 5\' 5"  (1.651 m)    Weight:   Wt Readings from Last 1 Encounters:  02/04/19 66.7 kg    Ideal Body Weight:  61.8 kg  BMI:  There is no height or weight on file to calculate BMI.  Estimated Nutritional Needs:   Kcal:  1500-1700  Protein:  94-113 g  Fluid:  1.7L   Lajuan Lines, RD, LDN  After Hours/Weekend Pager: [phone]

## 2019-02-13 NOTE — Discharge Instructions (Signed)
° ° °  1. Change dressings as needed °2. May shower but keep incisions covered and dry °3. Take lovenox to prevent blood clots °4. Take stool softeners as needed °5. Take pain meds as needed ° °

## 2019-02-13 NOTE — Op Note (Signed)
Date of Surgery: 02/13/2019  INDICATIONS: Mr. Trela is a 84 y.o.-year-old male who sustained a left hip fracture. The risks and benefits of the procedure discussed with the patient and family prior to the procedure and all questions were answered; consent was obtained.  PREOPERATIVE DIAGNOSIS: left subtrochanteric femur fracture   POSTOPERATIVE DIAGNOSIS: Same   PROCEDURE: Treatment of subtrochanteric fracture with intramedullary implant. CPT [id number]   SURGEON: N. Glee Arvin, M.D.   ASSIST: Starlyn Skeans [place], [place]; necessary for the timely completion of procedure and due to complexity of procedure.  ANESTHESIA: general   IV FLUIDS AND URINE: See anesthesia record   ESTIMATED BLOOD LOSS: 200 cc  IMPLANTS: [employer] InterTAN 10 x 38, 95/90 lag screws  DRAINS: None.   COMPLICATIONS: see description of procedure.   DESCRIPTION OF PROCEDURE: The patient was brought to the operating room and placed supine on the operating table. The patient's leg had been signed prior to the procedure. The patient had the anesthesia placed by the anesthesiologist. The prep verification and incision time-outs were performed to confirm that this was the correct patient, site, side and location. The patient had an SCD on the opposite lower extremity. The patient did receive antibiotics prior to the incision and was re-dosed during the procedure as needed at indicated intervals. The patient was positioned on the fracture table with the table in traction and internal rotation to reduce the hip. The well leg was placed in a scissor position and all bony prominences were well-padded. The patient had the lower extremity prepped and draped in the standard surgical fashion.  I first made an incision on the lateral aspect of the proximal femur right at the level of the subtrochanteric fracture.  Dissection was carried down to the IT band which was then sharply incised in line with the incision.  I then used a  Cobb elevator to lever the femoral neck posteriorly back into reduction which was then confirmed under x-ray.  The Cobb was then kept there to maintain the reduction.  I then made an incision 4 finger breadths superior to the greater trochanter. A guide pin was inserted into the tip of the greater trochanter under fluoroscopic guidance. An opening reamer was used to gain access to the femoral canal. The nail length was measured and inserted down the femoral canal to its proper depth. The appropriate version of insertion for the lag screw was found under fluoroscopy. A pin was inserted up the femoral neck through the jig. Then, a second antirotation pin was inserted inferior to the first pin. The length of the lag screw was then measured. The lag screw was inserted as near to center-center in the head as possible. The antirotation pin was then taken out and an interdigitating compression screw was placed in its place. The leg was taken out of traction, then the interdigitating compression screw was used to compress across the fracture. Compression was visualized on serial xrays.  The superior set screw was then locked down in place to make the nail and a fixed angle construct.  I then placed a single distal interlocking screw in the dynamic slot using perfect circle technique.  The screw had excellent purchase.  The wound was copiously irrigated with saline and the subcutaneous layer closed with 2.0 vicryl and the skin was reapproximated with staples. The wounds were cleaned and dried a final time and a sterile dressing was placed. The hip was taken through a range of motion at the end  of the case under fluoroscopic imaging to visualize the approach-withdraw phenomenon and confirm implant length in the head. The patient was then awakened from anesthesia and taken to the recovery room in stable condition. All counts were correct at the end of the case.   POSTOPERATIVE PLAN: The patient will be weight bearing as  tolerated and will return in 2 weeks for staple removal and the patient will receive DVT prophylaxis based on other medications, activity level, and risk ratio of bleeding to thrombosis.   Azucena Cecil, MD [facility] [phone] 4:08 PM

## 2019-02-13 NOTE — Transfer of Care (Signed)
Immediate Anesthesia Transfer of Care Note  Patient: Bryan Paul  Procedure(s) Performed: INTRAMEDULLARY (IM) NAIL INTERTROCHANTRIC, Left Femur (Left Leg Upper)  Patient Location: PACU  Anesthesia Type:General  Level of Consciousness: awake and drowsy  Airway & Oxygen Therapy: Patient Spontanous Breathing and Patient connected to nasal cannula oxygen  Post-op Assessment: Report given to RN and Post -op Vital signs reviewed and stable  Post vital signs: Reviewed and stable  Last Vitals:  Vitals Value Taken Time  BP 148/70 02/13/19 1637  Temp 36.1 C 02/13/19 1637  Pulse 69 02/13/19 1641  Resp 17 02/13/19 1641  SpO2 98 % 02/13/19 1641  Vitals shown include unvalidated device data.  Last Pain:  Vitals:   02/13/19 1637  TempSrc:   PainSc: (P) Asleep      Patients Stated Pain Goal: 0 (09/98/33 8250)  Complications: No apparent anesthesia complications

## 2019-02-13 NOTE — Progress Notes (Addendum)
1330 Pt to short stay via bed. Unable to remove left ring finger. Pt is very hard of hearing even with bil hearing aids on. Removed hearing aids and kept it in the room. I called Pt's son Boe Deans this morning.  Updated and aware of the planned surgery today. Questions answered. 1750 Received pt from PACU, alert, very hard of hearing. Communicating with pt thru writing. Denies pain at this time. 1810 I called Lavonte Palos to give him an update.

## 2019-02-13 NOTE — Consult Note (Addendum)
ORTHOPAEDIC CONSULTATION  REQUESTING PHYSICIAN: Damita Lack, MD  Chief Complaint: Right intertroch fracture  HPI: Bryan Paul is a 84 y.o. male who presents with right hip fracture s/p mechanical fall at home.  He normally ambulates with walker around the house.  Until recently, he was driving himself to the [place].  He has mild dementia and very hard of hearing.  The patient endorses severe pain in the right hip, that does not radiate, grinding in quality, worse with any movement, better with immobilization.  Denies LOC/fever/chills/nausea/vomiting.  Walks with assistive devices (walker, cane, wheelchair).  Lives with sons and family.  Denies LOC, neck pain, abd pain.  Ortho consulted for hip fracture management.  Past Medical History:  Diagnosis Date  . Adhesive capsulitis of shoulder 12/20/2009   Qualifier: Diagnosis of  By: Arnoldo Morale MD, Balinda Quails   . Anxiety 06/13/2011  . Atrial flutter ([place]) 03/06/2017  . CHF (congestive heart failure) ([place]) 01/07/2018  . CONDUCTIVE HEARING LOSS BILATERAL 06/16/2008   Qualifier: Diagnosis of  By: Arnoldo Morale MD, John E   . Dilated cardiomyopathy ([place]) 03/06/2017  . Fatigue 05/17/2017  . Hypertension   . HYPERTENSION 03/12/2007   Qualifier: Diagnosis of  By: Jimmye Norman, LPN, Winfield Cunas   . HYPERTROPHY PROSTATE W/UR OBST & OTH LUTS 04/28/2009   Qualifier: Diagnosis of  By: Arnoldo Morale MD, Balinda Quails ING HERN W/OBST W/O MENTION GANGREN UNILAT/UNS 10/31/2007   Qualifier: Diagnosis of  By: Arnoldo Morale MD, Balinda Quails   . Weight loss 06/13/2011   Past Surgical History:  Procedure Laterality Date  . colonscopy  09/07/2006   Social History   Socioeconomic History  . Marital status: Married    Spouse name: Not on file  . Number of children: Not on file  . Years of education: Not on file  . Highest education level: Not on file  Occupational History  . Not on file  Social Needs  . Financial resource strain: Not on file  . Food insecurity    Worry: Not on file     Inability: Not on file  . Transportation needs    Medical: Not on file    Non-medical: Not on file  Tobacco Use  . Smoking status: Former Smoker    Types: Cigarettes  . Smokeless tobacco: Never Used  Substance and Sexual Activity  . Alcohol use: No  . Drug use: No  . Sexual activity: Not on file  Lifestyle  . Physical activity    Days per week: Not on file    Minutes per session: Not on file  . Stress: Not on file  Relationships  . Social Herbalist on phone: Not on file    Gets together: Not on file    Attends religious service: Not on file    Active member of club or organization: Not on file    Attends meetings of clubs or organizations: Not on file    Relationship status: Not on file  Other Topics Concern  . Not on file  Social History Narrative  . Not on file   Family History  Problem Relation Age of Onset  . Aneurysm Other   . Stroke Father   . Anxiety disorder Sister   . CAD Neg Hx   . Sudden Cardiac Death Neg Hx    No Known Allergies Prior to Admission medications   Medication Sig Start Date End Date Taking? Authorizing Provider  acetaminophen (TYLENOL) 500 MG tablet Take 500  mg by mouth every 6 (six) hours as needed.   Yes [provider]  aspirin EC 81 MG tablet Take 1 tablet (81 mg total) by mouth daily. 03/05/18  Yes Baldo Daub, MD  citalopram (CELEXA) 10 MG tablet Take 20 mg by mouth daily.  01/07/18  Yes [provider]  methimazole (TAPAZOLE) 10 MG tablet Take 1.5 tablets (15 mg total) by mouth daily. 01/20/19  Yes Shamleffer, Konrad Dolores, MD  metoprolol succinate (TOPROL-XL) 25 MG 24 hr tablet Take 25 mg by mouth daily. 04/13/16 01/02/22 Yes [provider]  LORazepam (ATIVAN) 0.5 MG tablet TAKE 1 TABLET BY MOUTH TWICE DAILY AS NEEDED Patient taking differently: Take 0.5 mg by mouth 2 (two) times daily as needed for anxiety.  09/22/14   Eulis Foster, FNP   Dg Chest 1 View  Result Date: 02/12/2019 CLINICAL  DATA:  Fall EXAM: CHEST  1 VIEW COMPARISON:  Radiograph 02/04/2018, CT chest 01/11/2016 FINDINGS: The heart size and mediastinal contours are within normal limits. Both lungs are clear. The visualized soft tissues and osseous structures are unremarkable. IMPRESSION: No active disease. Electronically Signed   By: MD Kreg Shropshire   On: 02/12/2019 17:57   Dg Hip Unilat With Pelvis 2-3 Views Left  Result Date: 02/12/2019 CLINICAL DATA:  Fall, left hip pain, shortening of the left leg EXAM: DG HIP (WITH OR WITHOUT PELVIS) 2-3V LEFT COMPARISON:  None. FINDINGS: Highly comminuted intertrochanteric left femur fracture with marked varus angulation and foreshortening across the fracture line medial displacement of the distal fracture fragment by approximately 1 shaft width. Remaining bones of the pelvis are congruent. Femoral heads remain normally located. Degenerative changes present in lumbar spine. There is significant soft tissue swelling of the left hip with effusion. Remaining soft tissues are unremarkable. IMPRESSION: Comminuted, displaced and foreshortened, varus angulated intertrochanteric left femur fracture. Electronically Signed   By: MD Kreg Shropshire   On: 02/12/2019 17:55    All pertinent xrays, MRI, CT independently reviewed and interpreted  Positive ROS: All other systems have been reviewed and were otherwise negative with the exception of those mentioned in the HPI and as above.  Physical Exam: General: Alert, no acute distress Cardiovascular: No pedal edema Respiratory: No cyanosis, no use of accessory musculature GI: No organomegaly, abdomen is soft and non-tender Skin: No lesions in the area of chief complaint Neurologic: Sensation intact distally Psychiatric: Patient is competent for consent with normal mood and affect Lymphatic: No axillary or cervical lymphadenopathy  MUSCULOSKELETAL:  - severe pain with movement of the hip and extremity, LLE is in flexed and IR position - skin  intact - NVI distally - compartments soft  Assessment: Left intertrochanteric fracture  Plan: - surgical stabilization is recommended, patient and family are aware of r/b/a and wish to proceed, informed consent obtained - medically optimized for surgery this afternoon - hold anticoagulation for impending surgery  Thank you for the consult and the opportunity to see Mr. Duane Boston Glee Arvin, MD 7:17 AM

## 2019-02-13 NOTE — Progress Notes (Signed)
At [phone] patient consented for the foley catheter and pain medication.  Pain med was given.  Unfortunately, foley placement was unsuccessful.  Tried twice.  Patient however did void during shift.

## 2019-02-13 NOTE — Progress Notes (Signed)
CSW acknowledges SNF consult, pending PT/OT recs.   Nethra Mehlberg, LCSW [phone]  

## 2019-02-13 NOTE — Progress Notes (Signed)
Report received from [place], RN 5N.

## 2019-02-14 ENCOUNTER — Encounter (HOSPITAL_COMMUNITY): Payer: Self-pay | Admitting: Orthopaedic Surgery

## 2019-02-14 ENCOUNTER — Ambulatory Visit: Payer: PPO | Admitting: Internal Medicine

## 2019-02-14 LAB — CBC
HCT: 37.1 % — ABNORMAL LOW (ref 39.0–52.0)
Hemoglobin: 12.6 g/dL — ABNORMAL LOW (ref 13.0–17.0)
MCH: 29.5 pg (ref 26.0–34.0)
MCHC: 34 g/dL (ref 30.0–36.0)
MCV: 86.9 fL (ref 80.0–100.0)
Platelets: 296 10*3/uL (ref 150–400)
RBC: 4.27 MIL/uL (ref 4.22–5.81)
RDW: 13.2 % (ref 11.5–15.5)
WBC: 16.5 10*3/uL — ABNORMAL HIGH (ref 4.0–10.5)
nRBC: 0 % (ref 0.0–0.2)

## 2019-02-14 LAB — MAGNESIUM: Magnesium: 1.9 mg/dL (ref 1.7–2.4)

## 2019-02-14 LAB — BASIC METABOLIC PANEL
Anion gap: 10 (ref 5–15)
BUN: 23 mg/dL (ref 8–23)
CO2: 21 mmol/L — ABNORMAL LOW (ref 22–32)
Calcium: 8.4 mg/dL — ABNORMAL LOW (ref 8.9–10.3)
Chloride: 105 mmol/L (ref 98–111)
Creatinine, Ser: 0.78 mg/dL (ref 0.61–1.24)
GFR calc Af Amer: >60 mL/min (ref >60)
GFR calc non Af Amer: >60 mL/min (ref >60)
Glucose, Bld: 145 mg/dL — ABNORMAL HIGH (ref 70–99)
Potassium: 4.3 mmol/L (ref 3.5–5.1)
Sodium: 136 mmol/L (ref 135–145)

## 2019-02-14 LAB — T3: T3, Total: 118 ng/dL (ref 71–180)

## 2019-02-14 LAB — VITAMIN D 25 HYDROXY (VIT D DEFICIENCY, FRACTURES): Vit D, 25-Hydroxy: 20.4 ng/mL — ABNORMAL LOW (ref 30.0–100.0)

## 2019-02-14 MED ORDER — VITAMIN D 25 MCG (1000 UNIT) PO TABS
1000.0000 [IU] | ORAL_TABLET | Freq: Every day | ORAL | Status: DC
  Administered 2019-02-14 – 2019-02-21 (×8): 1000 [IU] via ORAL
  Filled 2019-02-14 (×8): qty 1

## 2019-02-14 NOTE — TOC Initial Note (Signed)
Transition of Care [facility]) - Initial/Assessment Note    Patient Details  Name: Bryan Paul MRN: [id number] Date of Birth: Jul 24, 1935  Transition of [facility]) CM/SW Contact:    Alberteen Sam, [place] Phone Number: [phone] 02/14/2019, 1:15 PM  Clinical Narrative:                  CSW consulted with patient's son [name] regarding discharge planning and informed of SNF recommendation, as patient is not fully oriented. He is hesitant for patient to go to SNF however reports if it is needed for short term rehab he would agree to [facility] or [facility]. [name] gave CSW permission to fax referrals to both facilities for bed offers, pending bed offers at this time.   Expected Discharge Plan: Skilled Nursing Facility Barriers to Discharge: Continued Medical Work up   Patient Goals and CMS Choice   CMS Medicare.gov Compare Post Acute Care list provided to:: Patient Represenative (must comment)(lynn (son)) Choice offered to / list presented to : Adult Children(son lynn)  Expected Discharge Plan and Services Expected Discharge Plan: [place] Choice: [place] arrangements for the past 2 months: Single Family Home                                      Prior Living Arrangements/Services Living arrangements for the past 2 months: Single Family Home Lives with:: Self Patient language and need for interpreter reviewed:: Yes Do you feel safe going back to the place where you live?: No   needs short term rehab  Need for Family Participation in Patient Care: Yes (Comment) Care giver support system in place?: Yes (comment)   Criminal Activity/Legal Involvement Pertinent to Current Situation/Hospitalization: No - Comment as needed  Activities of Daily Living      Permission Sought/Granted Permission sought to share information with : Case Manager, Family Supports, Facility Sport and exercise psychologist    Share  Information with NAME: [name]  Permission granted to share info w AGENCY: SNFs  Permission granted to share info w Relationship: son  Permission granted to share info w Contact Information: [phone]  Emotional Assessment Appearance:: Other (Comment Required(unable to assess) Attitude/Demeanor/Rapport: Unable to Assess Affect (typically observed): Unable to Assess Orientation: : Oriented to Self, Oriented to Place Alcohol / Substance Use: Not Applicable Psych Involvement: No (comment)  Admission diagnosis:  Closed comminuted intertrochanteric fracture of left femur, initial encounter [facility]) [S72.142A] Patient Active Problem List   Diagnosis Date Noted  . Protein-calorie malnutrition, severe 02/13/2019  . Closed comminuted intertrochanteric fracture of proximal end of left femur, initial encounter ([place]) 02/12/2019  . Essential hypertension 02/12/2019  . Fall at home, initial encounter 02/12/2019  . Hip fracture ([place]) 02/12/2019  . Hyperthyroidism 02/03/2019  . On amiodarone therapy 01/03/2019  . Dizzy 01/03/2019  . CHF (congestive heart failure) ([place]) 01/07/2018  . Chronic anticoagulation 01/07/2018  . Fatigue 05/17/2017  . Atrial flutter ([place]) 03/06/2017  . Dilated cardiomyopathy ([place]) 03/06/2017  . Weight loss 06/13/2011  . Anxiety 06/13/2011  . HYPERTROPHY PROSTATE W/UR OBST & OTH LUTS 04/28/2009  . CONDUCTIVE HEARING LOSS BILATERAL 06/16/2008  . ING HERN W/OBST W/O MENTION GANGREN UNILAT/UNS 10/31/2007  . Hypertensive heart disease with heart failure ([place]) 03/12/2007   PCP:  Ronita Hipps, MD Pharmacy:   [facility] [address] [place] [address] [place] [address] Phone: [phone] Fax: [phone]  [id number], [place] - [address] AT [facility] [address] [place] [address] Phone: [phone] Fax: [phone]     Social Determinants of Health (SDOH) Interventions    Readmission Risk  Interventions No flowsheet data found.

## 2019-02-14 NOTE — Evaluation (Signed)
Clinical/Bedside Swallow Evaluation Patient Details  Name: Bryan Paul MRN: [id number] Date of Birth: 06-Apr-1935  Today's Date: 02/14/2019 Time: SLP Start Time (ACUTE ONLY): 7829 SLP Stop Time (ACUTE ONLY): 0945 SLP Time Calculation (min) (ACUTE ONLY): 40 min  Past Medical History:  Past Medical History:  Diagnosis Date  . Adhesive capsulitis of shoulder 12/20/2009   Qualifier: Diagnosis of  By: Arnoldo Morale MD, Balinda Quails   . Anxiety 06/13/2011  . Atrial flutter ([place]) 03/06/2017  . CHF (congestive heart failure) ([place]) 01/07/2018  . CONDUCTIVE HEARING LOSS BILATERAL 06/16/2008   Qualifier: Diagnosis of  By: Arnoldo Morale MD, Shareka Casale E   . Dilated cardiomyopathy ([place]) 03/06/2017  . Fatigue 05/17/2017  . Hypertension   . HYPERTENSION 03/12/2007   Qualifier: Diagnosis of  By: Jimmye Norman, LPN, Winfield Cunas   . HYPERTROPHY PROSTATE W/UR OBST & OTH LUTS 04/28/2009   Qualifier: Diagnosis of  By: Arnoldo Morale MD, Balinda Quails ING HERN W/OBST W/O MENTION GANGREN UNILAT/UNS 10/31/2007   Qualifier: Diagnosis of  By: Arnoldo Morale MD, Balinda Quails   . Weight loss 06/13/2011   Past Surgical History:  Past Surgical History:  Procedure Laterality Date  . colonscopy  09/07/2006   HPI:  84 yo male with onset of fall and resulting L closed comminuted intertrochanteric fracture was referred to rehab after IM nailing, WBAT.  Pt was home alone per chart, had unwitnessed fall.  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  PMHx:  dilated cardiomyopathy, a-flutter, CHF, malnutrition, HTN, conductive hearing loss. Prior to this hospitalization, patient was declining in PO intake and had significant weight loss. He was intubated during the surgery only.   Assessment / Plan / Recommendation Clinical Impression  Patient presents with a mild pharyngeal dysphagia with persistent throat clearing prior to, during and after PO's. Not likely that this is post-extubation throat clearing as he was only intubated for surgery, and GERD/Reflux would be more  probable.He did not exhibit any immediate coughing with thin liquids, and had one instance of throat clearing leading to coughing during regular solids. He would benefit from a visit from SLP to check diet toleration.  SLP Visit Diagnosis: Dysphagia, unspecified (R13.10)    Aspiration Risk  Mild aspiration risk    Diet Recommendation Regular;Thin liquid   Liquid Administration via: Straw;Cup Medication Administration: Whole meds with liquid Supervision: Patient able to self feed;Intermittent supervision to cue for compensatory strategies Compensations: Minimize environmental distractions;Slow rate;Small sips/bites    Other  Recommendations Oral Care Recommendations: Oral care BID   Follow up Recommendations None      Frequency and Duration min 1 x/week  1 week       Prognosis Prognosis for Safe Diet Advancement: Good Barriers to Reach Goals: Cognitive deficits      Swallow Study   General Date of Onset: 02/12/19 HPI: 84 yo male with onset of fall and resulting L closed comminuted intertrochanteric fracture was referred to rehab after IM nailing, WBAT.  Pt was home alone per chart, had unwitnessed fall.  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  PMHx:  dilated cardiomyopathy, a-flutter, CHF, malnutrition, HTN, conductive hearing loss. Prior to this hospitalization, patient was declining in PO intake and had significant weight loss. He was intubated during the surgery only. Type of Study: Bedside Swallow Evaluation Previous Swallow Assessment: N/A Diet Prior to this Study: Regular;Thin liquids Temperature Spikes Noted: No Respiratory Status: Room air History of Recent Intubation: Yes Length of Intubations (days): 1 days(just during surgery) Date extubated: 02/13/19 Behavior/Cognition: Alert;Cooperative;Pleasant  mood Oral Cavity Assessment: Within Functional Limits Oral Care Completed by SLP: Yes Oral Cavity - Dentition: Adequate natural dentition Vision: Functional for  self-feeding Self-Feeding Abilities: Able to feed self;Needs set up Patient Positioning: Upright in bed Baseline Vocal Quality: Normal Volitional Cough: Weak Volitional Swallow: Able to elicit    Oral/Motor/Sensory Function Overall Oral Motor/Sensory Function: Within functional limits   Ice Chips     Thin Liquid Thin Liquid: Within functional limits Presentation: Straw    Nectar Thick     Honey Thick     Puree Puree: Not tested   Solid     Solid: Impaired Other Comments: Difficult to differentiate as patient had a persistent throat clear prior to PO's, but he did have a brief episode of coughing after solid PO's      Pablo Lawrence 02/14/2019,1:28 PM  Angela Nevin, MA, CCC-SLP Speech Therapy [facility] Acute Rehab Pager: [phone]

## 2019-02-14 NOTE — Progress Notes (Signed)
Pt is sitting up in the chair, very poor appetite. I called his son [name] for an update.

## 2019-02-14 NOTE — Progress Notes (Signed)
Subjective: 1 Day Post-Op Procedure(s) (LRB): INTRAMEDULLARY (IM) NAIL INTERTROCHANTRIC, Left Femur (Left) Patient reports pain as mild.  Resting comfortably this am.   Objective: Vital signs in last 24 hours: Temp:  [97 F (36.1 C)-98.2 F (36.8 C)] 98 F (36.7 C) (07/10 0437) Pulse Rate:  [67-86] 71 (07/10 0437) Resp:  [13-18] 18 (07/10 0437) BP: (121-176)/(67-89) 141/89 (07/10 0437) SpO2:  [94 %-100 %] 98 % (07/10 0437)  Intake/Output from previous day: 07/09 0701 - 07/10 0700 In: 1478.3 [I.V.:1378.3; IV Piggyback:100] Out: 75 [Blood:75] Intake/Output this shift: No intake/output data recorded.  Recent Labs    02/12/19 1720 02/13/19 0241 02/14/19 0242  HGB 15.0 14.8 12.6*   Recent Labs    02/13/19 0241 02/14/19 0242  WBC 10.3 16.5*  RBC 5.02 4.27  HCT 43.3 37.1*  PLT 309 296   Recent Labs    02/13/19 0241 02/14/19 0242  NA 133* 136  K 4.1 4.3  CL 101 105  CO2 22 21*  BUN 25* 23  CREATININE 0.86 0.78  GLUCOSE 132* 145*  CALCIUM 8.7* 8.4*   Recent Labs    02/12/19 1720  INR 1.1    Neurologically intact Neurovascular intact Sensation intact distally Intact pulses distally Dorsiflexion/Plantar flexion intact Incision: dressing C/D/I No cellulitis present Compartment soft   Assessment/Plan: 1 Day Post-Op Procedure(s) (LRB): INTRAMEDULLARY (IM) NAIL INTERTROCHANTRIC, Left Femur (Left) Up with therapy  WBAT LLE ABLA- mild and stable D/c dispo per medicine F/u with Dr. Erlinda Hong 2 weeks post-op for staple removal      Aundra Dubin 02/14/2019, 7:38 AM

## 2019-02-14 NOTE — Care Management Important Message (Signed)
Important Message  Patient Details  Name: Bryan Paul MRN: [id number] Date of Birth: March 27, 1935   Medicare Important Message Given:  Yes     Orbie Pyo 02/14/2019, 3:22 PM

## 2019-02-14 NOTE — Progress Notes (Signed)
Pt presents with above diagnosis. PTA pt PLOF unable to retreive due to cognitive deficits. Pt reports living with son and receiving assistance with ADLs. Pt limited due to pain, weakness, and decreased function. Pt tolerated session with written communication due to [facility]. Pt will benefit from continued acute OT to address deficits and to maximize independence in ADLs prior to d/c to SNF level.    02/14/19 1200  OT Visit Information  Last OT Received On 02/14/19  Assistance Needed +2  Reason for Co-Treatment Complexity of the patient's impairments (multi-system involvement);For patient/therapist safety;To address functional/ADL transfers  OT goals addressed during session ADL's and self-care;Proper use of Adaptive equipment and DME  History of Present Illness 84 yo male with onset of fall and resulting L closed comminuted intertrochanteric fracture was referred to rehab after IM nailing, WBAT.  Pt was home alone per chart, had unwitnessed fall.  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  PMHx:  dilated cardiomyopathy, a-flutter, CHF, malnutrition, HTN, conductive hearing loss,   Precautions  Precautions Fall  Precaution Comments HOH, does not seem to know sign language  Restrictions  Weight Bearing Restrictions Yes  LLE Weight Bearing WBAT  Home Living  Family/patient expects to be discharged to: Private residence  Living Arrangements Children  Available Help at Discharge Family  Type of Home House  Home Access Level entry  Home Layout Bed/bath upstairs  Bathroom Shower/Tub Walk-in shower  Home Equipment Shower seat;Walker - 2 wheels  Additional Comments Reports living with one son. Needs further information due to pt being a poor historian.  Prior Function  Level of Independence Needs assistance  Gait / Transfers Assistance Needed RW with help per pt  ADL's / Homemaking Assistance Needed Son assists with ADLs in home environment  Communication / Swallowing Assistance Needed HOH,  partially deaf.   Communication  Communication HOH;Deaf (written communication to pt and he responds verbally)  Pain Assessment  Pain Assessment Faces  Faces Pain Scale 6  Pain Location L hip with standing effort or to transition to edge of bed  Pain Descriptors / Indicators Operative site guarding  Pain Intervention(s) Monitored during session;Premedicated before session;Repositioned;Limited activity within patient's tolerance  Cognition  Arousal/Alertness Awake/alert  Behavior During Therapy Impulsive;Flat affect  Overall Cognitive Status History of cognitive impairments - at baseline  General Comments family not there to verify pt's history information  Upper Extremity Assessment  Upper Extremity Assessment Defer to OT evaluation  Lower Extremity Assessment  Lower Extremity Assessment Defer to PT evaluation  Cervical / Trunk Assessment  Cervical / Trunk Assessment Kyphotic  ADL  Overall ADL's  Needs assistance/impaired  Eating/Feeding Set up;Bed level  Grooming Wash/dry hands;Wash/dry face;Oral care;Set up;Bed level  Upper Body Bathing Minimal assistance;Sitting  Lower Body Bathing Moderate assistance;Sitting/lateral leans  Upper Body Dressing  Minimal assistance;Sitting  Lower Body Dressing Maximal assistance  Toilet Transfer Maximal assistance;+2 for safety/equipment;+2 for physical assistance;Squat-pivot  Toilet Transfer Details (indicate cue type and reason) simulated toilet transfer from bed to recliner with Max A +2, pivot to chair.   Functional mobility during ADLs Maximal assistance;+2 for physical assistance;+2 for safety/equipment  General ADL Comments Pt demonstrates difficulty with sitting tolerance due to weakness to perform ADL tasks.   Bed Mobility  Overal bed mobility Needs Assistance  Bed Mobility Supine to Sit  Supine to sit Mod assist;+2 for physical assistance;+2 for safety/equipment;HOB elevated  General bed mobility comments pt is requiring cues for all  mobility   Transfers  Overall transfer level Needs assistance  Equipment used Rolling walker (2 wheeled);2 person hand held assist  Transfers Sit to/from [facility]  Sit to Stand Max assist;+2 physical assistance;+2 safety/equipment;From elevated surface  Stand pivot transfers Max assist;+2 physical assistance;+2 safety/equipment;From elevated surface  General transfer comment pt is assisting with sit to stand but could not help SPT by stepping  Balance  Overall balance assessment History of Falls;Needs assistance  Sitting-balance support Feet supported;Bilateral upper extremity supported  Sitting balance-Leahy Scale Fair  Sitting balance - Comments tends to lean forward off bedside and requires cues to sit upright  Postural control Other (comment) (forward lean)  Standing balance support Bilateral upper extremity supported;During functional activity  Standing balance-Leahy Scale Poor  General Comments  General comments (skin integrity, edema, etc.) pt was assisted by PT to the chair, could not use RW as pt is not able to fully stand upright unless PT is more hands on to support his posture  Exercises  Exercises  (L hip is 3/5, knee and ankle 4/5)  OT - End of Session  Equipment Utilized During Treatment Gait belt;Rolling walker  Activity Tolerance Patient tolerated treatment well  Patient left in chair;with call bell/phone within reach;with chair alarm set  Nurse Communication Mobility status;Weight bearing status  OT Assessment  OT Recommendation/Assessment Patient needs continued OT Services  OT Visit Diagnosis Unsteadiness on feet (R26.81);Repeated falls (R29.6);Muscle weakness (generalized) (M62.81);Pain  Pain - Right/Left Left  Pain - part of body Hip  OT Problem List Decreased activity tolerance;Impaired balance (sitting and/or standing);Decreased cognition;Decreased knowledge of use of DME or AE;Decreased safety awareness;Pain  OT Plan  OT Frequency (ACUTE  ONLY) Min 2X/week  OT Treatment/Interventions (ACUTE ONLY) Self-care/ADL training;Therapeutic exercise;DME and/or AE instruction;Therapeutic activities;Patient/family education;Balance training;Cognitive remediation/compensation  AM-PAC OT "6 Clicks" Daily Activity Outcome Measure (Version 2)  Help from another person eating meals? 3  Help from another person taking care of personal grooming? 3  Help from another person toileting, which includes using toliet, bedpan, or urinal? 2  Help from another person bathing (including washing, rinsing, drying)? 2  Help from another person to put on and taking off regular upper body clothing? 3  Help from another person to put on and taking off regular lower body clothing? 2  6 Click Score 15  OT Recommendation  Recommendations for Other Services Speech consult  Follow Up Recommendations SNF;Supervision/Assistance - 24 hour  OT Equipment 3 in 1 bedside commode  Individuals Consulted  Consulted and Agree with Results and Recommendations Patient unable/family or caregiver not available  Acute Rehab OT Goals  Patient Stated Goal none stated  OT Goal Formulation Patient unable to participate in goal setting  Time For Goal Achievement 02/28/19  Potential to Achieve Goals Fair  OT Time Calculation  OT Start Time (ACUTE ONLY) 1130  OT Stop Time (ACUTE ONLY) 1206  OT Time Calculation (min) 36 min  OT General Charges  $OT Visit 1 Visit  OT Evaluation  $OT Eval Moderate Complexity 1 Mod  Written Expression  Dominant Hand Right   Minus Breeding, [place], OTR/L  Supplemental Rehabilitation Services  [phone]

## 2019-02-14 NOTE — Progress Notes (Signed)
Called pt son Bryan Paul @[phone] to give him an update. Stated that he will drop off some pictures so that staff can hang them up in his room where he can see them.

## 2019-02-14 NOTE — Plan of Care (Signed)
  Problem: Pain Management: Goal: Pain level will decrease Outcome: Progressing   

## 2019-02-14 NOTE — Progress Notes (Signed)
PROGRESS NOTE    Bryan Paul  MAU:[id number] DOB: 12-20-1934 DOA: 02/12/2019 PCP: Marylen Ponto, MD   Brief Narrative:  84 year old with history of atrial flutter, dilated cardiomyopathy, essential hypertension, amiodarone induced hypothyroidism, difficulty hearing and mild dementia came to the hospital while patient had unwitnessed fall after patient's family went on an errand.  They found him on the floor.  He was brought to the hospital for further evaluation.  He was noted to have left intertrochanteric fracture.  Orthopedic team was consulted.   Assessment & Plan:   Principal Problem:   Closed comminuted intertrochanteric fracture of proximal end of left femur, initial encounter (HCC) Active Problems:   CONDUCTIVE HEARING LOSS BILATERAL   Atrial flutter (HCC)   Dilated cardiomyopathy (HCC)   CHF (congestive heart failure) (HCC)   Hyperthyroidism   Essential hypertension   Fall at home, initial encounter   Hip fracture (HCC)   Protein-calorie malnutrition, severe  Fall leading to left intertrochanteric fracture of his hip S/p intramedullary nail to intertrochanteric left femur on 7/9 - Per Dr Roda Shutters Lovenox for 2 weeks followed by ASA 81mg  BID for 4 weeks.  -PT/OT, pain control, bowel regimen -Incentive spirometer  Dilated cardiomyopathy - Echocardiogram from 01/30/2019 showed ejection fraction 60 to 65%. -Continue supportive care.  History of atrial flutter -Not on anticoagulation due to risk of falling.  On aspirin.  Status post cardioversion in June 2019.  Unable to tolerate amiodarone.  Currently on Toprol.  Bilateral conductive hearing loss, chronic -Supportive care.  Hyperthyroidism -Follow-up thyroid studies.  Continue home meds  Essential hypertension -On metoprolol.  DVT prophylaxis: None preoperatively, postop per surgical team Code Status: DNR Family Communication: Spoke with patient's son over the phone.  He is in agreement with the plan.  Disposition Plan: Maintain hospital stay as he requires therapy and appropriate disposition thereafter  Consultants:   Orthopedic  Procedures:   Status post ORIF 7/9  Antimicrobials:   Preop   Subjective: Feels much better.  Extremely hard of hearing therefore communicated using paper and marker.  He understands that he will be working with physical therapy today and will determine disposition accordingly.  Review of Systems Otherwise negative except as per HPI, including: General = no fevers, chills, dizziness, malaise, fatigue HEENT/EYES = negative for pain, redness, loss of vision, double vision, blurred vision, loss of hearing, sore throat, hoarseness, dysphagia Cardiovascular= negative for chest pain, palpitation, murmurs, lower extremity swelling Respiratory/lungs= negative for shortness of breath, cough, hemoptysis, wheezing, mucus production Gastrointestinal= negative for nausea, vomiting,, abdominal pain, melena, hematemesis Genitourinary= negative for Dysuria, Hematuria, Change in Urinary Frequency MSK = Negative for arthralgia, myalgias, Back Pain, Joint swelling  Neurology= Negative for headache, seizures, numbness, tingling  Psychiatry= Negative for anxiety, depression, suicidal and homocidal ideation Allergy/Immunology= Medication/Food allergy as listed  Skin= Negative for Rash, lesions, ulcers, itching   Objective: Vitals:   02/13/19 2008 02/13/19 2319 02/14/19 0437 02/14/19 0752  BP: 121/67 (!) 143/72 (!) 141/89 134/71  Pulse: 76 79 71 70  Resp: 18 18 18 16   Temp: 98.2 F (36.8 C) 98.2 F (36.8 C) 98 F (36.7 C) 98.4 F (36.9 C)  TempSrc: Oral Oral Oral Oral  SpO2: 97% 97% 98% 99%    Intake/Output Summary (Last 24 hours) at 02/14/2019 1055 Last data filed at 02/14/2019 0309 Gross per 24 hour  Intake 1478.25 ml  Output 75 ml  Net 1403.25 ml   There were no vitals filed for this visit.  Examination:  Constitutional:  NAD, calm, comfortable,  extremely hard of hearing. Eyes: PERRL, lids and conjunctivae normal ENMT: Mucous membranes are moist. Posterior pharynx clear of any exudate or lesions.Normal dentition.  Neck: normal, supple, no masses, no thyromegaly Respiratory: clear to auscultation bilaterally, no wheezing, no crackles. Normal respiratory effort. No accessory muscle use.  Cardiovascular: Regular rate and rhythm, no murmurs / rubs / gallops. No extremity edema. 2+ pedal pulses. No carotid bruits.  Abdomen: no tenderness, no masses palpated. No hepatosplenomegaly. Bowel sounds positive.  Musculoskeletal: no clubbing / cyanosis. No joint deformity upper and lower extremities. Good ROM, no contractures. Normal muscle tone.  Skin: Hip dressing noted without any evidence of bleeding or infection Neurologic: CN 2-12 grossly intact. Sensation intact, DTR normal. Strength 5/5 in all 4.  Psychiatric: Normal judgment and insight. Alert and oriented x 3. Normal mood.    Data Reviewed:   CBC: Recent Labs  Lab 02/12/19 1720 02/13/19 0241 02/14/19 0242  WBC 10.0 10.3 16.5*  NEUTROABS 7.6  --   --   HGB 15.0 14.8 12.6*  HCT 44.9 43.3 37.1*  MCV 88.7 86.3 86.9  PLT 269 309 296   Basic Metabolic Panel: Recent Labs  Lab 02/12/19 1720 02/13/19 0241 02/14/19 0242  NA 134* 133* 136  K 3.8 4.1 4.3  CL 101 101 105  CO2 18* 22 21*  GLUCOSE 118* 132* 145*  BUN 28* 25* 23  CREATININE 0.82 0.86 0.78  CALCIUM 8.5* 8.7* 8.4*  MG  --   --  1.9   GFR: Estimated Creatinine Clearance: 61.9 mL/min (by C-G formula based on SCr of 0.78 mg/dL). Liver Function Tests: Recent Labs  Lab 02/12/19 2206  ALBUMIN 3.4*   No results for input(s): LIPASE, AMYLASE in the last 168 hours. No results for input(s): AMMONIA in the last 168 hours. Coagulation Profile: Recent Labs  Lab 02/12/19 1720  INR 1.1   Cardiac Enzymes: No results for input(s): CKTOTAL, CKMB, CKMBINDEX, TROPONINI in the last 168 hours. BNP (last 3 results) Recent  Labs    01/03/19 1710  PROBNP 944*   HbA1C: No results for input(s): HGBA1C in the last 72 hours. CBG: No results for input(s): GLUCAP in the last 168 hours. Lipid Profile: No results for input(s): CHOL, HDL, LDLCALC, TRIG, CHOLHDL, LDLDIRECT in the last 72 hours. Thyroid Function Tests: Recent Labs    02/13/19 0241  TSH 0.017*  FREET4 3.06*   Anemia Panel: No results for input(s): VITAMINB12, FOLATE, FERRITIN, TIBC, IRON, RETICCTPCT in the last 72 hours. Sepsis Labs: No results for input(s): PROCALCITON, LATICACIDVEN in the last 168 hours.  Recent Results (from the past 240 hour(s))  SARS Coronavirus 2 (CEPHEID - Performed in [facility] hospital lab), Hosp Order     Status: None   Collection Time: 02/12/19  5:20 PM   Specimen: Nasopharyngeal Swab  Result Value Ref Range Status   SARS Coronavirus 2 NEGATIVE NEGATIVE Final    Comment: (NOTE) If result is NEGATIVE SARS-CoV-2 target nucleic acids are NOT DETECTED. The SARS-CoV-2 RNA is generally detectable in upper and lower  respiratory specimens during the acute phase of infection. The lowest  concentration of SARS-CoV-2 viral copies this assay can detect is 250  copies / mL. A negative result does not preclude SARS-CoV-2 infection  and should not be used as the sole basis for treatment or other  patient management decisions.  A negative result may occur with  improper specimen collection / handling, submission of specimen other  than nasopharyngeal swab, presence  of viral mutation(s) within the  areas targeted by this assay, and inadequate number of viral copies  (<250 copies / mL). A negative result must be combined with clinical  observations, patient history, and epidemiological information. If result is POSITIVE SARS-CoV-2 target nucleic acids are DETECTED. The SARS-CoV-2 RNA is generally detectable in upper and lower  respiratory specimens dur ing the acute phase of infection.  Positive  results are indicative of  active infection with SARS-CoV-2.  Clinical  correlation with patient history and other diagnostic information is  necessary to determine patient infection status.  Positive results do  not rule out bacterial infection or co-infection with other viruses. If result is PRESUMPTIVE POSTIVE SARS-CoV-2 nucleic acids MAY BE PRESENT.   A presumptive positive result was obtained on the submitted specimen  and confirmed on repeat testing.  While 2019 novel coronavirus  (SARS-CoV-2) nucleic acids may be present in the submitted sample  additional confirmatory testing may be necessary for epidemiological  and / or clinical management purposes  to differentiate between  SARS-CoV-2 and other Sarbecovirus currently known to infect humans.  If clinically indicated additional testing with an alternate test  methodology [phone] is advised. The SARS-CoV-2 RNA is generally  detectable in upper and lower respiratory sp ecimens during the acute  phase of infection. The expected result is Negative. Fact Sheet for Patients:  BoilerBrush.com.cyhttps://www.fda.gov/media/136312/download Fact Sheet for Healthcare Providers: https://pope.com/https://www.fda.gov/media/136313/download This test is not yet approved or cleared by the [place] FDA and has been authorized for detection and/or diagnosis of SARS-CoV-2 by FDA under an Emergency Use Authorization (EUA).  This EUA will remain in effect (meaning this test can be used) for the duration of the COVID-19 declaration under Section 564(b)(1) of the Act, 21 U.S.C. section 360bbb-3(b)(1), unless the authorization is terminated or revoked sooner. Performed at [facility], [address] [place], [place] [address]   Surgical pcr screen     Status: Abnormal   Collection Time: 02/12/19 11:15 PM   Specimen: Nasal Mucosa; Nasal Swab  Result Value Ref Range Status   MRSA, PCR POSITIVE (A) NEGATIVE Final   Staphylococcus aureus POSITIVE (A) NEGATIVE Final    Comment: RESULT  CALLED TO, READ BACK BY AND VERIFIED WITH: Corky DownsS WHITEHORN RN 02/13/19 0055 JDW (NOTE) The Xpert SA Assay (FDA approved for NASAL specimens in patients 82 years of age and older), is one component of a comprehensive surveillance program. It is not intended to diagnose infection nor to guide or monitor treatment. Performed at [facility], [address] [place], [place] [address]          Radiology Studies: Dg Chest 1 View  Result Date: 02/12/2019 CLINICAL DATA:  Fall EXAM: CHEST  1 VIEW COMPARISON:  Radiograph 02/04/2018, CT chest 01/11/2016 FINDINGS: The heart size and mediastinal contours are within normal limits. Both lungs are clear. The visualized soft tissues and osseous structures are unremarkable. IMPRESSION: No active disease. Electronically Signed   By: MD Kreg ShropshirePrice  DeHay   On: 02/12/2019 17:57   Pelvis Portable  Result Date: 02/13/2019 CLINICAL DATA:  Status post ORIF of proximal left femoral fracture EXAM: PORTABLE PELVIS 1-2 VIEWS COMPARISON:  Intraoperative films from earlier in the same day. FINDINGS: Medullary rod is noted within the proximal left femur. Two fixation screws are noted traversing the femoral neck. Fracture fragments are in near anatomic alignment. IMPRESSION: ORIF of proximal left femoral fracture. Electronically Signed   By: Alcide CleverMark  Lukens M.D.   On: 02/13/2019 17:28  Dg C-arm 1-60 Min  Result Date: 02/13/2019 CLINICAL DATA:  ORIF. EXAM: LEFT FEMUR 2 VIEWS; DG C-ARM 61-120 MIN COMPARISON:  02/12/2019. FINDINGS: ORIF left femur. Hardware intact. Anatomic alignment. 2 minutes 27 seconds fluoroscopy time utilized. IMPRESSION: ORIF left femur.  Anatomic alignment. Electronically Signed   By: Marcello Moores  Register   On: 02/13/2019 16:29   Dg Hip Unilat With Pelvis 2-3 Views Left  Result Date: 02/12/2019 CLINICAL DATA:  Fall, left hip pain, shortening of the left leg EXAM: DG HIP (WITH OR WITHOUT PELVIS) 2-3V LEFT COMPARISON:  None. FINDINGS: Highly comminuted  intertrochanteric left femur fracture with marked varus angulation and foreshortening across the fracture line medial displacement of the distal fracture fragment by approximately 1 shaft width. Remaining bones of the pelvis are congruent. Femoral heads remain normally located. Degenerative changes present in lumbar spine. There is significant soft tissue swelling of the left hip with effusion. Remaining soft tissues are unremarkable. IMPRESSION: Comminuted, displaced and foreshortened, varus angulated intertrochanteric left femur fracture. Electronically Signed   By: MD Lovena Le   On: 02/12/2019 17:55   Dg Femur Min 2 Views Left  Result Date: 02/13/2019 CLINICAL DATA:  ORIF. EXAM: LEFT FEMUR 2 VIEWS; DG C-ARM 61-120 MIN COMPARISON:  02/12/2019. FINDINGS: ORIF left femur. Hardware intact. Anatomic alignment. 2 minutes 27 seconds fluoroscopy time utilized. IMPRESSION: ORIF left femur.  Anatomic alignment. Electronically Signed   By: Marcello Moores  Register   On: 02/13/2019 16:29        Scheduled Meds: . acetaminophen  500 mg Oral Q6H  . cholecalciferol  1,000 Units Oral Daily  . citalopram  20 mg Oral Daily  . docusate sodium  100 mg Oral BID  . enoxaparin (LOVENOX) injection  40 mg Subcutaneous Q24H  . methimazole  15 mg Oral Daily  . metoprolol tartrate  2.5 mg Intravenous Q12H  . mupirocin ointment  1 application Nasal BID   Continuous Infusions: . sodium chloride 75 mL/hr at 02/13/19 1854  . lactated ringers 10 mL/hr at 02/13/19 1406  . methocarbamol (ROBAXIN) IV       LOS: 2 days   Time spent= 35 mins    Takayla Baillie Arsenio Loader, MD [facility]  If 7PM-7AM, please contact night-coverage [email or web address] 02/14/2019, 10:55 AM

## 2019-02-14 NOTE — [place] FL2 (Signed)
[place] MEDICAID FL2 LEVEL OF CARE SCREENING TOOL     IDENTIFICATION  Patient Name: Bryan Paul Birthdate: [birth year] Sex: male Admission Date (Current Location): 02/12/2019  [facility] and [place] Number:  [employer] and Address:  [facility], [address] [place], [place] [address]      Provider Number: [id number]  Attending Physician Name and Address:  Dimple Nanas, MD  Relative Name and Phone Number:  [name] ([phone]    Current Level of Care: Hospital Recommended Level of Care: Skilled Nursing Facility Prior Approval Number:    Date Approved/Denied:   PASRR Number: [id number]  Discharge Plan: SNF    Current Diagnoses: Patient Active Problem List   Diagnosis Date Noted  . Protein-calorie malnutrition, severe 02/13/2019  . Closed comminuted intertrochanteric fracture of proximal end of left femur, initial encounter (HCC) 02/12/2019  . Essential hypertension 02/12/2019  . Fall at home, initial encounter 02/12/2019  . Hip fracture (HCC) 02/12/2019  . Hyperthyroidism 02/03/2019  . On amiodarone therapy 01/03/2019  . Dizzy 01/03/2019  . CHF (congestive heart failure) (HCC) 01/07/2018  . Chronic anticoagulation 01/07/2018  . Fatigue 05/17/2017  . Atrial flutter (HCC) 03/06/2017  . Dilated cardiomyopathy (HCC) 03/06/2017  . Weight loss 06/13/2011  . Anxiety 06/13/2011  . HYPERTROPHY PROSTATE W/UR OBST & OTH LUTS 04/28/2009  . CONDUCTIVE HEARING LOSS BILATERAL 06/16/2008  . ING HERN W/OBST W/O MENTION GANGREN UNILAT/UNS 10/31/2007  . Hypertensive heart disease with heart failure (HCC) 03/12/2007    Orientation RESPIRATION BLADDER Height & Weight     Self, Place  Normal Incontinent, External catheter Weight:   Height:     BEHAVIORAL SYMPTOMS/MOOD NEUROLOGICAL BOWEL NUTRITION STATUS      Incontinent Diet(see discharge summary)  AMBULATORY STATUS COMMUNICATION OF NEEDS Skin   Extensive Assist Verbally (leg  closed surgical incision)                       Personal Care Assistance Level of Assistance  Bathing, Feeding, Dressing, Total care Bathing Assistance: Limited assistance Feeding assistance: Independent Dressing Assistance: Limited assistance Total Care Assistance: Maximum assistance   Functional Limitations Info  Sight, Hearing, Speech Sight Info: Adequate Hearing Info: Impaired(hearing aid) Speech Info: Adequate    SPECIAL CARE FACTORS FREQUENCY  PT (By licensed PT), OT (By licensed OT)     PT Frequency: min 5x weekly OT Frequency: min 5x weekly            Contractures Contractures Info: Not present    Additional Factors Info  Code Status, Allergies Code Status Info: DNR Allergies Info: No Known Allergies           Current Medications (02/14/2019):  This is the current hospital active medication list Current Facility-Administered Medications  Medication Dose Route Frequency Provider Last Rate Last Dose  . 0.9 %  sodium chloride infusion   Intravenous Continuous Tarry Kos, MD 75 mL/hr at 02/13/19 1854    . acetaminophen (TYLENOL) tablet 325-650 mg  325-650 mg Oral Q6H PRN Tarry Kos, MD      . acetaminophen (TYLENOL) tablet 500 mg  500 mg Oral Q6H Tarry Kos, MD   500 mg at 02/14/19 0612  . alum & mag hydroxide-simeth (MAALOX/MYLANTA) 200-200-20 MG/5ML suspension 30 mL  30 mL Oral Q4H PRN Tarry Kos, MD      . bisacodyl (DULCOLAX) suppository 10 mg  10 mg Rectal Daily PRN Tarry Kos, MD      .  cholecalciferol (VITAMIN D3) tablet 1,000 Units  1,000 Units Oral Daily Damita Lack, MD   1,000 Units at 02/14/19 1048  . citalopram (CELEXA) tablet 20 mg  20 mg Oral Daily Leandrew Koyanagi, MD   20 mg at 02/14/19 1048  . docusate sodium (COLACE) capsule 100 mg  100 mg Oral BID Leandrew Koyanagi, MD   100 mg at 02/14/19 1048  . enoxaparin (LOVENOX) injection 40 mg  40 mg Subcutaneous Q24H Leandrew Koyanagi, MD   40 mg at 02/14/19 1024  .  guaiFENesin-dextromethorphan (ROBITUSSIN DM) 100-10 MG/5ML syrup 5 mL  5 mL Oral Q4H PRN Leandrew Koyanagi, MD      . hydrALAZINE (APRESOLINE) injection 10 mg  10 mg Intravenous Q4H PRN Leandrew Koyanagi, MD      . HYDROcodone-acetaminophen (NORCO) 7.5-325 MG per tablet 1-2 tablet  1-2 tablet Oral Q4H PRN Leandrew Koyanagi, MD      . HYDROcodone-acetaminophen (NORCO/VICODIN) 5-325 MG per tablet 1-2 tablet  1-2 tablet Oral Q4H PRN Leandrew Koyanagi, MD      . hydrocortisone (ANUSOL-HC) 2.5 % rectal cream 1 application  1 application Topical QID PRN Leandrew Koyanagi, MD      . hydrocortisone cream 1 % 1 application  1 application Topical TID PRN Leandrew Koyanagi, MD      . ipratropium-albuterol (DUONEB) 0.5-2.5 (3) MG/3ML nebulizer solution 3 mL  3 mL Nebulization Q4H PRN Leandrew Koyanagi, MD      . lactated ringers infusion   Intravenous Continuous Leandrew Koyanagi, MD 10 mL/hr at 02/13/19 1406    . lip balm (CARMEX) ointment 1 application  1 application Topical PRN Leandrew Koyanagi, MD      . loratadine (CLARITIN) tablet 10 mg  10 mg Oral Daily PRN Leandrew Koyanagi, MD      . LORazepam (ATIVAN) injection 0.5 mg  0.5 mg Intravenous Q12H PRN Leandrew Koyanagi, MD      . magnesium citrate solution 1 Bottle  1 Bottle Oral Once PRN Leandrew Koyanagi, MD      . menthol-cetylpyridinium (CEPACOL) lozenge 3 mg  1 lozenge Oral PRN Leandrew Koyanagi, MD       Or  . phenol (CHLORASEPTIC) mouth spray 1 spray  1 spray Mouth/Throat PRN Leandrew Koyanagi, MD      . methimazole (TAPAZOLE) tablet 15 mg  15 mg Oral Daily Leandrew Koyanagi, MD   15 mg at 02/13/19 2339  . methocarbamol (ROBAXIN) tablet 500 mg  500 mg Oral Q6H PRN Leandrew Koyanagi, MD       Or  . methocarbamol (ROBAXIN) 500 mg in dextrose 5 % 50 mL IVPB  500 mg Intravenous Q6H PRN Leandrew Koyanagi, MD      . metoprolol tartrate (LOPRESSOR) injection 2.5 mg  2.5 mg Intravenous Q12H Leandrew Koyanagi, MD   2.5 mg at 02/14/19 1024  . morphine 2 MG/ML injection 1 mg  1 mg Intravenous Q2H PRN Leandrew Koyanagi, MD      .  mupirocin ointment (BACTROBAN) 2 % 1 application  1 application Nasal BID Leandrew Koyanagi, MD   1 application at 15/17/61 1051  . Muscle Rub CREA 1 application  1 application Topical PRN Leandrew Koyanagi, MD      . ondansetron [facility]) tablet 4 mg  4 mg Oral Q6H PRN Leandrew Koyanagi, MD       Or  . ondansetron [facility]) injection  4 mg  4 mg Intravenous Q6H PRN Tarry KosXu, Naiping M, MD      . polyethylene glycol (MIRALAX / GLYCOLAX) packet 17 g  17 g Oral Daily PRN Tarry KosXu, Naiping M, MD      . polyvinyl alcohol (LIQUIFILM TEARS) 1.4 % ophthalmic solution 1 drop  1 drop Both Eyes PRN Tarry KosXu, Naiping M, MD      . senna-docusate (Senokot-S) tablet 2 tablet  2 tablet Oral QHS PRN Tarry KosXu, Naiping M, MD      . sodium chloride (OCEAN) 0.65 % nasal spray 1 spray  1 spray Each Nare PRN Tarry KosXu, Naiping M, MD      . sorbitol 70 % solution 30 mL  30 mL Oral Daily PRN Tarry KosXu, Naiping M, MD         Discharge Medications: Please see discharge summary for a list of discharge medications.  Relevant Imaging Results:  Relevant Lab Results:   Additional Information SSN: [id number]  Gildardo GriffesAshley M Kierstyn Baranowski, LCSW

## 2019-02-14 NOTE — Evaluation (Addendum)
Physical Therapy Evaluation Patient Details Name: Bryan Paul MRN: [id number] DOB: Oct 30, 1934 Today's Date: 02/14/2019   History of Present Illness  84 yo male with onset of fall and resulting L closed comminuted intertrochanteric fracture was referred to rehab after IM nailing, WBAT.  Pt was home alone per chart, had unwitnessed fall.  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  PMHx:  dilated cardiomyopathy, a-flutter, CHF, malnutrition, HTN, conductive hearing loss,   Clinical Impression  Pt was seen for mobility and assessed for tolerance for activity with written communication for the entire session to him, pt verbalized responses as he could recall.  Pt clearly has memory issues but seems definite about the answers given to OT and PT.  Will need clarification from family, but this will not change the initial decision to ask for SNF placement.  He is unable to stand without a great deal of help and cannot take a step, thereby making a transition to a two story home fairly difficult.  Follow up as tolerated for standing and balance training, to work on gait and strengthening to BLE's as able.  Up in recliner with legs elevated, making a good effort to participate with PT and OT.    Follow Up Recommendations SNF    Equipment Recommendations  None recommended by PT    Recommendations for Other Services       Precautions / Restrictions Precautions Precautions: Fall Precaution Comments: HOH, does not seem to know sign language Restrictions Weight Bearing Restrictions: Yes LLE Weight Bearing: Weight bearing as tolerated      Mobility  Bed Mobility Overal bed mobility: Needs Assistance Bed Mobility: Supine to Sit     Supine to sit: Mod assist;+2 for physical assistance;+2 for safety/equipment;HOB elevated     General bed mobility comments: pt is requiring cues for all mobility   Transfers Overall transfer level: Needs assistance Equipment used: Rolling walker (2  wheeled);2 person hand held assist Transfers: Sit to/from [employer] Transfers Sit to Stand: Max assist;+2 physical assistance;+2 safety/equipment;From elevated surface Stand pivot transfers: Max assist;+2 physical assistance;+2 safety/equipment;From elevated surface       General transfer comment: pt is assisting with sit to stand but could not help SPT by stepping  Ambulation/Gait             General Gait Details: unable to take a step  Stairs            Wheelchair Mobility    Modified Rankin (Stroke Patients Only)       Balance Overall balance assessment: History of Falls;Needs assistance Sitting-balance support: Feet supported;Bilateral upper extremity supported Sitting balance-Leahy Scale: Fair Sitting balance - Comments: tends to lean forward off bedside and requires cues to sit upright Postural control: Other (comment)(forward lean) Standing balance support: Bilateral upper extremity supported;During functional activity Standing balance-Leahy Scale: Poor                               Pertinent Vitals/Pain Pain Assessment: Faces Faces Pain Scale: Hurts even more Pain Location: L hip with standing effort or to transition to edge of bed Pain Descriptors / Indicators: Operative site guarding Pain Intervention(s): Monitored during session;Premedicated before session;Repositioned;Limited activity within patient's tolerance    Home Living Family/patient expects to be discharged to:: Private residence Living Arrangements: Children Available Help at Discharge: Family Type of Home: House Home Access: Level entry     Home Layout: Bed/bath upstairs Home Equipment: Clinical cytogeneticist -  2 wheels Additional Comments: Reports living with one son. Needs further information due to pt being a poor historian.    Prior Function Level of Independence: Needs assistance   Gait / Transfers Assistance Needed: RW with help per pt  ADL's / Homemaking  Assistance Needed: Son assists with ADLs in home environment        Hand Dominance   Dominant Hand: Right    Extremity/Trunk Assessment   Upper Extremity Assessment Upper Extremity Assessment: Defer to OT evaluation    Lower Extremity Assessment Lower Extremity Assessment: LLE deficits/detail LLE Deficits / Details: L hip 3/5, knee and ankle 4/5 LLE: Unable to fully assess due to pain LLE Coordination: decreased fine motor;decreased gross motor    Cervical / Trunk Assessment Cervical / Trunk Assessment: Kyphotic  Communication   Communication: HOH;Deaf(written communication to pt and he responds verbally)  Cognition Arousal/Alertness: Awake/alert Behavior During Therapy: Impulsive;Flat affect Overall Cognitive Status: History of cognitive impairments - at baseline                                 General Comments: family not there to verify pt's history information      General Comments General comments (skin integrity, edema, etc.): pt was assisted by PT to the chair, could not use RW as pt is not able to fully stand upright unless PT is more hands on to support his posture    Exercises     Assessment/Plan    PT Assessment Patient needs continued PT services  PT Problem List Decreased strength;Decreased range of motion;Decreased activity tolerance;Decreased balance;Decreased mobility;Decreased coordination;Decreased cognition;Decreased knowledge of use of DME;Decreased safety awareness;Decreased skin integrity;Pain       PT Treatment Interventions DME instruction;Gait training;Stair training;Functional mobility training;Therapeutic activities;Therapeutic exercise;Balance training;Neuromuscular re-education;Patient/family education    PT Goals (Current goals can be found in the Care Plan section)  Acute Rehab PT Goals Patient Stated Goal: none stated PT Goal Formulation: Patient unable to participate in goal setting Time For Goal Achievement:  02/28/19 Potential to Achieve Goals: Good    Frequency Min 2X/week   Barriers to discharge Inaccessible home environment;Decreased caregiver support has two floors and must go up to bedroom    Co-evaluation PT/OT/SLP Co-Evaluation/Treatment: Yes Reason for Co-Treatment: Complexity of the patient's impairments (multi-system involvement);For patient/therapist safety;To address functional/ADL transfers PT goals addressed during session: Mobility/safety with mobility;Proper use of DME OT goals addressed during session: ADL's and self-care       AM-PAC PT "6 Clicks" Mobility  Outcome Measure Help needed turning from your back to your side while in a flat bed without using bedrails?: A Little Help needed moving from lying on your back to sitting on the side of a flat bed without using bedrails?: A Lot Help needed moving to and from a bed to a chair (including a wheelchair)?: A Lot Help needed standing up from a chair using your arms (e.g., wheelchair or bedside chair)?: A Lot Help needed to walk in hospital room?: Total Help needed climbing 3-5 steps with a railing? : Total 6 Click Score: 11    End of Session Equipment Utilized During Treatment: Gait belt Activity Tolerance: Patient tolerated treatment well;Patient limited by fatigue;Treatment limited secondary to medical complications (Comment)(weakness post op) Patient left: in chair;with call bell/phone within reach;with chair alarm set Nurse Communication: Mobility status PT Visit Diagnosis: Unsteadiness on feet (R26.81);Muscle weakness (generalized) (M62.81);Difficulty in walking, not elsewhere classified (R26.2);Pain Pain -  Right/Left: Left Pain - part of body: Hip    Time: [id number] PT Time Calculation (min) (ACUTE ONLY): 35 min   Charges:   PT Evaluation $PT Eval Moderate Complexity: 1 Mod         Ivar DrapeRuth E Hadasa Gasner 02/14/2019, 12:52 PM

## 2019-02-15 ENCOUNTER — Other Ambulatory Visit: Payer: Self-pay

## 2019-02-15 LAB — CBC
HCT: 32 % — ABNORMAL LOW (ref 39.0–52.0)
Hemoglobin: 10.5 g/dL — ABNORMAL LOW (ref 13.0–17.0)
MCH: 29.3 pg (ref 26.0–34.0)
MCHC: 32.8 g/dL (ref 30.0–36.0)
MCV: 89.4 fL (ref 80.0–100.0)
Platelets: 255 10*3/uL (ref 150–400)
RBC: 3.58 MIL/uL — ABNORMAL LOW (ref 4.22–5.81)
RDW: 13.2 % (ref 11.5–15.5)
WBC: 8.2 10*3/uL (ref 4.0–10.5)
nRBC: 0 % (ref 0.0–0.2)

## 2019-02-15 LAB — BASIC METABOLIC PANEL
Anion gap: 10 (ref 5–15)
BUN: 23 mg/dL (ref 8–23)
CO2: 21 mmol/L — ABNORMAL LOW (ref 22–32)
Calcium: 7.9 mg/dL — ABNORMAL LOW (ref 8.9–10.3)
Chloride: 103 mmol/L (ref 98–111)
Creatinine, Ser: 0.69 mg/dL (ref 0.61–1.24)
GFR calc Af Amer: >60 mL/min (ref >60)
GFR calc non Af Amer: >60 mL/min (ref >60)
Glucose, Bld: 83 mg/dL (ref 70–99)
Potassium: 3.9 mmol/L (ref 3.5–5.1)
Sodium: 134 mmol/L — ABNORMAL LOW (ref 135–145)

## 2019-02-15 LAB — MAGNESIUM: Magnesium: 1.9 mg/dL (ref 1.7–2.4)

## 2019-02-15 NOTE — Plan of Care (Signed)
  Problem: Activity: Goal: Ability to ambulate and perform ADLs will improve Outcome: Progressing   Problem: Pain Management: Goal: Pain level will decrease Outcome: Progressing   

## 2019-02-15 NOTE — Progress Notes (Signed)
Physical Therapy Treatment Patient Details Name: Bryan Paul MRN: [id number] DOB: 01/20/1935 Today's Date: 02/15/2019    History of Present Illness 84 yo male with onset of fall and resulting L closed comminuted intertrochanteric fracture was referred to rehab after IM nailing, WBAT.  Pt was home alone per chart, had unwitnessed fall.  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  PMHx:  dilated cardiomyopathy, a-flutter, CHF, malnutrition, HTN, conductive hearing loss,     PT Comments    Pt supine in bed on arrival.  He continues to only communicate with white board to understand directions or questions.  Pt soiled in urine after standing.  He continues to require max +2 to achieve standing.  Plan next session for continued activtity tolerance.  Pt remains to require SNF placement to improve strength and function before returning home.      Follow Up Recommendations  SNF     Equipment Recommendations       Recommendations for Other Services       Precautions / Restrictions Precautions Precautions: Fall Precaution Comments: Deaf use white board to communicate Restrictions Weight Bearing Restrictions: Yes LLE Weight Bearing: Weight bearing as tolerated    Mobility  Bed Mobility Overal bed mobility: Needs Assistance Bed Mobility: Supine to Sit     Supine to sit: Max assist     General bed mobility comments: Pt required max assistance to advance LEs and elevate trunk into sitting.  Required increased facilitation as he could not hear.  Transfers Overall transfer level: Needs assistance Equipment used: Rolling walker (2 wheeled) Transfers: Sit to/from Stand Sit to Stand: Max assist;+2 physical assistance Stand pivot transfers: Max assist;+2 physical assistance       General transfer comment: Pt continues to require max +2 for sit to stand with RW.  Removed RW for pivot as he was unable to utilize RW to take steps toward recliner  chair.  Ambulation/Gait Ambulation/Gait assistance: (Unable.)               Stairs             Wheelchair Mobility    Modified Rankin (Stroke Patients Only)       Balance Overall balance assessment: History of Falls;Needs assistance Sitting-balance support: Feet supported;Bilateral upper extremity supported Sitting balance-Leahy Scale: Poor       Standing balance-Leahy Scale: Poor Standing balance comment: External assistance to maintain standing.                            Cognition Arousal/Alertness: Awake/alert Behavior During Therapy: Flat affect Overall Cognitive Status: History of cognitive impairments - at baseline                                 General Comments: Pt does not appear to understand his situation.  He reports he's not ready to start rehab because he does not have his clothes.  Re-oriented patient that he was hospitalized and clothes were not needed.      Exercises      General Comments        Pertinent Vitals/Pain Pain Assessment: Faces Faces Pain Scale: Hurts even more Pain Location: left hip Pain Descriptors / Indicators: Grimacing;Discomfort;Aching Pain Intervention(s): Monitored during session;Repositioned    Home Living                      Prior Function  PT Goals (current goals can now be found in the care plan section) Acute Rehab PT Goals Patient Stated Goal: none stated Potential to Achieve Goals: Good Progress towards PT goals: Progressing toward goals    Frequency    Min 3X/week      PT Plan Current plan remains appropriate    Co-evaluation              AM-PAC PT "6 Clicks" Mobility   Outcome Measure  Help needed turning from your back to your side while in a flat bed without using bedrails?: A Lot Help needed moving from lying on your back to sitting on the side of a flat bed without using bedrails?: Total Help needed moving to and from a bed to  a chair (including a wheelchair)?: Total Help needed standing up from a chair using your arms (e.g., wheelchair or bedside chair)?: Total Help needed to walk in hospital room?: Total Help needed climbing 3-5 steps with a railing? : Total 6 Click Score: 7    End of Session Equipment Utilized During Treatment: Gait belt Activity Tolerance: Patient tolerated treatment well;Patient limited by fatigue;Treatment limited secondary to medical complications (Comment) Patient left: in chair;with call bell/phone within reach;with chair alarm set Nurse Communication: Mobility status PT Visit Diagnosis: Unsteadiness on feet (R26.81);Muscle weakness (generalized) (M62.81);Difficulty in walking, not elsewhere classified (R26.2);Pain Pain - Right/Left: Left Pain - part of body: Hip     Time: 3833-3832 PT Time Calculation (min) (ACUTE ONLY): 19 min  Charges:  $Therapeutic Activity: 8-22 mins                     Bryan Paul, PTA Acute Rehabilitation Services Pager [phone] Office [phone]     Bryan Paul Bryan Paul 02/15/2019, 1:11 PM

## 2019-02-15 NOTE — Progress Notes (Signed)
Urias Sheek (son) was given an update on the patient's status .

## 2019-02-15 NOTE — Progress Notes (Signed)
RN spoke with Delight Stare, patient's son, to update him of the patients status.   Patient currently resting in bed.  Nursing will continue to monitor.

## 2019-02-15 NOTE — Progress Notes (Signed)
Patient resting comfortably in bed this morning.  Not really c/o pain. Dressings c/d/i.  NVI. Up with PT Family would like him to go home if possible. Stable from ortho stand point

## 2019-02-15 NOTE — Progress Notes (Signed)
PROGRESS NOTE    Bryan Paul  HQI:[id number] DOB: 1934-10-21 DOA: 02/12/2019 PCP: Marylen Ponto, MD   Brief Narrative:  84 year old with history of atrial flutter, dilated cardiomyopathy, essential hypertension, amiodarone induced hypothyroidism, difficulty hearing and mild dementia came to the hospital while patient had unwitnessed fall after patient's family went on an errand.  They found him on the floor.  He was brought to the hospital for further evaluation.  He was noted to have left intertrochanteric fracture.  Orthopedic team was consulted.   Assessment & Plan:   Principal Problem:   Closed comminuted intertrochanteric fracture of proximal end of left femur, initial encounter (HCC) Active Problems:   CONDUCTIVE HEARING LOSS BILATERAL   Atrial flutter (HCC)   Dilated cardiomyopathy (HCC)   CHF (congestive heart failure) (HCC)   Hyperthyroidism   Essential hypertension   Fall at home, initial encounter   Hip fracture (HCC)   Protein-calorie malnutrition, severe  Fall leading to left intertrochanteric fracture of his hip S/p intramedullary nail to intertrochanteric left femur on 7/9; stable - Per Dr Roda Shutters Lovenox for 2 weeks followed by ASA 81mg  BID for 4 weeks.  -PT/OT, pain control, bowel regimen -Incentive spirometer  Dilated cardiomyopathy - Echocardiogram from 01/30/2019 showed ejection fraction 60 to 65%. -Continue supportive care.  History of atrial flutter; stable -Not on anticoagulation due to risk of falling.  On aspirin.  Status post cardioversion in June 2019.  Unable to tolerate amiodarone.  Currently on Toprol.  Bilateral conductive hearing loss, chronic -Supportive care.  Hyperthyroidism -Follow-up thyroid studies.  Continue home meds  Essential hypertension -On metoprolol.  DVT prophylaxis: None preoperatively, postop per surgical team Code Status: DNR Family Communication: Spoke with Son over the phone, [name].   Disposition Plan: SNF  placement.   Consultants:   Orthopedic  Procedures:   Status post ORIF 7/9  Antimicrobials:   Preop   Subjective: Hard of hearing but effectively communicating with dry erase board.  No complaints.   Review of Systems Otherwise negative except as per HPI, including: General = no fevers, chills, dizziness, malaise, fatigue HEENT/EYES = negative for pain, redness, loss of vision, double vision, blurred vision, loss of hearing, sore throat, hoarseness, dysphagia Cardiovascular= negative for chest pain, palpitation, murmurs, lower extremity swelling Respiratory/lungs= negative for shortness of breath, cough, hemoptysis, wheezing, mucus production Gastrointestinal= negative for nausea, vomiting,, abdominal pain, melena, hematemesis Genitourinary= negative for Dysuria, Hematuria, Change in Urinary Frequency MSK = Negative for arthralgia, myalgias, Back Pain, Joint swelling  Neurology= Negative for headache, seizures, numbness, tingling  Psychiatry= Negative for anxiety, depression, suicidal and homocidal ideation Allergy/Immunology= Medication/Food allergy as listed  Skin= Negative for Rash, lesions, ulcers, itching  Objective: Vitals:   02/14/19 2032 02/15/19 0408 02/15/19 0841 02/15/19 0908  BP: 122/62 (!) 125/57 131/61 (!) 115/98  Pulse: 88 77 79 82  Resp: 17 18  18   Temp: 98.5 F (36.9 C) 98.6 F (37 C) 98.4 F (36.9 C)   TempSrc: Oral Oral Oral   SpO2: 99% 92% 98% 98%    Intake/Output Summary (Last 24 hours) at 02/15/2019 1123 Last data filed at 02/15/2019 0800 Gross per 24 hour  Intake 480 ml  Output -  Net 480 ml   There were no vitals filed for this visit.  Examination:  Constitutional: NAD, calm, comfortable Eyes: PERRL, lids and conjunctivae normal ENMT: Mucous membranes are moist. Posterior pharynx clear of any exudate or lesions.Normal dentition.  Neck: normal, supple, no masses, no thyromegaly Respiratory: clear to  auscultation bilaterally, no  wheezing, no crackles. Normal respiratory effort. No accessory muscle use.  Cardiovascular: Regular rate and rhythm, no murmurs / rubs / gallops. No extremity edema. 2+ pedal pulses. No carotid bruits.  Abdomen: no tenderness, no masses palpated. No hepatosplenomegaly. Bowel sounds positive.  Musculoskeletal: no clubbing / cyanosis. No joint deformity upper and lower extremities. Good ROM, no contractures. Normal muscle tone.  Skin: Hip dressing noted without any evidence of infection or bleeding Neurologic: CN 2-12 grossly intact. Sensation intact, DTR normal. Strength 5/5 in all 4.  Psychiatric: Normal judgment and insight. Alert and oriented x 3. Normal mood.   Data Reviewed:   CBC: Recent Labs  Lab 02/12/19 1720 02/13/19 0241 02/14/19 0242 02/15/19 0248  WBC 10.0 10.3 16.5* 8.2  NEUTROABS 7.6  --   --   --   HGB 15.0 14.8 12.6* 10.5*  HCT 44.9 43.3 37.1* 32.0*  MCV 88.7 86.3 86.9 89.4  PLT 269 309 296 255   Basic Metabolic Panel: Recent Labs  Lab 02/12/19 1720 02/13/19 0241 02/14/19 0242 02/15/19 0248  NA 134* 133* 136 134*  K 3.8 4.1 4.3 3.9  CL 101 101 105 103  CO2 18* 22 21* 21*  GLUCOSE 118* 132* 145* 83  BUN 28* 25* 23 23  CREATININE 0.82 0.86 0.78 0.69  CALCIUM 8.5* 8.7* 8.4* 7.9*  MG  --   --  1.9 1.9   GFR: Estimated Creatinine Clearance: 61.9 mL/min (by C-G formula based on SCr of 0.69 mg/dL). Liver Function Tests: Recent Labs  Lab 02/12/19 2206  ALBUMIN 3.4*   No results for input(s): LIPASE, AMYLASE in the last 168 hours. No results for input(s): AMMONIA in the last 168 hours. Coagulation Profile: Recent Labs  Lab 02/12/19 1720  INR 1.1   Cardiac Enzymes: No results for input(s): CKTOTAL, CKMB, CKMBINDEX, TROPONINI in the last 168 hours. BNP (last 3 results) Recent Labs    01/03/19 1710  PROBNP 944*   HbA1C: No results for input(s): HGBA1C in the last 72 hours. CBG: No results for input(s): GLUCAP in the last 168 hours. Lipid  Profile: No results for input(s): CHOL, HDL, LDLCALC, TRIG, CHOLHDL, LDLDIRECT in the last 72 hours. Thyroid Function Tests: Recent Labs    02/13/19 0241  TSH 0.017*  FREET4 3.06*   Anemia Panel: No results for input(s): VITAMINB12, FOLATE, FERRITIN, TIBC, IRON, RETICCTPCT in the last 72 hours. Sepsis Labs: No results for input(s): PROCALCITON, LATICACIDVEN in the last 168 hours.  Recent Results (from the past 240 hour(s))  SARS Coronavirus 2 (CEPHEID - Performed in [facility] hospital lab), Hosp Order     Status: None   Collection Time: 02/12/19  5:20 PM   Specimen: Nasopharyngeal Swab  Result Value Ref Range Status   SARS Coronavirus 2 NEGATIVE NEGATIVE Final    Comment: (NOTE) If result is NEGATIVE SARS-CoV-2 target nucleic acids are NOT DETECTED. The SARS-CoV-2 RNA is generally detectable in upper and lower  respiratory specimens during the acute phase of infection. The lowest  concentration of SARS-CoV-2 viral copies this assay can detect is 250  copies / mL. A negative result does not preclude SARS-CoV-2 infection  and should not be used as the sole basis for treatment or other  patient management decisions.  A negative result may occur with  improper specimen collection / handling, submission of specimen other  than nasopharyngeal swab, presence of viral mutation(s) within the  areas targeted by this assay, and inadequate number of viral copies  (<  250 copies / mL). A negative result must be combined with clinical  observations, patient history, and epidemiological information. If result is POSITIVE SARS-CoV-2 target nucleic acids are DETECTED. The SARS-CoV-2 RNA is generally detectable in upper and lower  respiratory specimens dur ing the acute phase of infection.  Positive  results are indicative of active infection with SARS-CoV-2.  Clinical  correlation with patient history and other diagnostic information is  necessary to determine patient infection status.   Positive results do  not rule out bacterial infection or co-infection with other viruses. If result is PRESUMPTIVE POSTIVE SARS-CoV-2 nucleic acids MAY BE PRESENT.   A presumptive positive result was obtained on the submitted specimen  and confirmed on repeat testing.  While 2019 novel coronavirus  (SARS-CoV-2) nucleic acids may be present in the submitted sample  additional confirmatory testing may be necessary for epidemiological  and / or clinical management purposes  to differentiate between  SARS-CoV-2 and other Sarbecovirus currently known to infect humans.  If clinically indicated additional testing with an alternate test  methodology [phone]) is advised. The SARS-CoV-2 RNA is generally  detectable in upper and lower respiratory sp ecimens during the acute  phase of infection. The expected result is Negative. Fact Sheet for Patients:  BoilerBrush.com.cyhttps://www.fda.gov/media/136312/download Fact Sheet for Healthcare Providers: https://pope.com/https://www.fda.gov/media/136313/download This test is not yet approved or cleared by the [place] FDA and has been authorized for detection and/or diagnosis of SARS-CoV-2 by FDA under an Emergency Use Authorization (EUA).  This EUA will remain in effect (meaning this test can be used) for the duration of the COVID-19 declaration under Section 564(b)(1) of the Act, 21 U.S.C. section 360bbb-3(b)(1), unless the authorization is terminated or revoked sooner. Performed at [facility], [address] [place], [place] [address]   Surgical pcr screen     Status: Abnormal   Collection Time: 02/12/19 11:15 PM   Specimen: Nasal Mucosa; Nasal Swab  Result Value Ref Range Status   MRSA, PCR POSITIVE (A) NEGATIVE Final   Staphylococcus aureus POSITIVE (A) NEGATIVE Final    Comment: RESULT CALLED TO, READ BACK BY AND VERIFIED WITH: Corky DownsS [place] RN 02/13/19 0055 JDW (NOTE) The Xpert SA Assay (FDA approved for NASAL specimens in patients 82 years of age  and older), is one component of a comprehensive surveillance program. It is not intended to diagnose infection nor to guide or monitor treatment. Performed at [facility], [address] [place], [place] [address]          Radiology Studies: Pelvis Portable  Result Date: 02/13/2019 CLINICAL DATA:  Status post ORIF of proximal left femoral fracture EXAM: PORTABLE PELVIS 1-2 VIEWS COMPARISON:  Intraoperative films from earlier in the same day. FINDINGS: Medullary rod is noted within the proximal left femur. Two fixation screws are noted traversing the femoral neck. Fracture fragments are in near anatomic alignment. IMPRESSION: ORIF of proximal left femoral fracture. Electronically Signed   By: Alcide CleverMark  Lukens M.D.   On: 02/13/2019 17:28   Dg C-arm 1-60 Min  Result Date: 02/13/2019 CLINICAL DATA:  ORIF. EXAM: LEFT FEMUR 2 VIEWS; DG C-ARM 61-120 MIN COMPARISON:  02/12/2019. FINDINGS: ORIF left femur. Hardware intact. Anatomic alignment. 2 minutes 27 seconds fluoroscopy time utilized. IMPRESSION: ORIF left femur.  Anatomic alignment. Electronically Signed   By: Maisie Fushomas  Register   On: 02/13/2019 16:29   Dg Femur Min 2 Views Left  Result Date: 02/13/2019 CLINICAL DATA:  ORIF. EXAM: LEFT FEMUR 2 VIEWS; DG C-ARM 61-120 MIN  COMPARISON:  02/12/2019. FINDINGS: ORIF left femur. Hardware intact. Anatomic alignment. 2 minutes 27 seconds fluoroscopy time utilized. IMPRESSION: ORIF left femur.  Anatomic alignment. Electronically Signed   By: [place]   On: 02/13/2019 16:29        Scheduled Meds: . cholecalciferol  1,000 Units Oral Daily  . citalopram  20 mg Oral Daily  . docusate sodium  100 mg Oral BID  . enoxaparin (LOVENOX) injection  40 mg Subcutaneous Q24H  . methimazole  15 mg Oral Daily  . metoprolol tartrate  2.5 mg Intravenous Q12H  . mupirocin ointment  1 application Nasal BID   Continuous Infusions: . sodium chloride 75 mL/hr at 02/13/19 1854  . lactated ringers 10 mL/hr  at 02/13/19 1406  . methocarbamol (ROBAXIN) IV       LOS: 3 days   Time spent= 35 mins    Panagiotis Oelkers Arsenio Loader, MD [facility]  If 7PM-7AM, please contact night-coverage [email or web address] 02/15/2019, 11:23 AM

## 2019-02-16 LAB — BASIC METABOLIC PANEL
Anion gap: 8 (ref 5–15)
BUN: 22 mg/dL (ref 8–23)
CO2: 22 mmol/L (ref 22–32)
Calcium: 8 mg/dL — ABNORMAL LOW (ref 8.9–10.3)
Chloride: 104 mmol/L (ref 98–111)
Creatinine, Ser: 0.65 mg/dL (ref 0.61–1.24)
GFR calc Af Amer: >60 mL/min (ref >60)
GFR calc non Af Amer: >60 mL/min (ref >60)
Glucose, Bld: 90 mg/dL (ref 70–99)
Potassium: 4.3 mmol/L (ref 3.5–5.1)
Sodium: 134 mmol/L — ABNORMAL LOW (ref 135–145)

## 2019-02-16 LAB — CBC
HCT: 30.7 % — ABNORMAL LOW (ref 39.0–52.0)
Hemoglobin: 10.1 g/dL — ABNORMAL LOW (ref 13.0–17.0)
MCH: 29.3 pg (ref 26.0–34.0)
MCHC: 32.9 g/dL (ref 30.0–36.0)
MCV: 89 fL (ref 80.0–100.0)
Platelets: 272 10*3/uL (ref 150–400)
RBC: 3.45 MIL/uL — ABNORMAL LOW (ref 4.22–5.81)
RDW: 13.1 % (ref 11.5–15.5)
WBC: 7.1 10*3/uL (ref 4.0–10.5)
nRBC: 0 % (ref 0.0–0.2)

## 2019-02-16 LAB — MAGNESIUM: Magnesium: 1.9 mg/dL (ref 1.7–2.4)

## 2019-02-16 NOTE — Plan of Care (Signed)
  Problem: Activity: Goal: Ability to ambulate and perform ADLs will improve Outcome: Progressing   Problem: Pain Management: Goal: Pain level will decrease Outcome: Progressing   

## 2019-02-16 NOTE — Progress Notes (Signed)
Attempted to call the contact person, [name] (son)- no answer and a brief update was given via voice message.

## 2019-02-16 NOTE — Progress Notes (Signed)
RN spoke to patient's son, Juwaun Inskeep, and gave him update about patient's status. Patient's son is appreciative to the nursing staffs and expressed joy that he is able to hear his father's voice on the background.

## 2019-02-16 NOTE — TOC Progression Note (Signed)
Transition of Care [facility]) - Progression Note    Patient Details  Name: Bryan Paul MRN: [id number] Date of Birth: Dec 30, 1934  Transition [facility]) CM/SW [place], [place] Phone Number: [phone] 02/16/2019, 9:33 AM  Clinical Narrative:    CSW received a call from MD yesterday inquiring about SNF options. CSW let MD know that no bed offers had been received.  Today, CSW reached out to April at [facility] to ascertain if they had reviewed patient's referral. April stated that she would look into it and get back with CSW.  CSW called patient's son [name] and updated him about status of bed offers and that CSW had reached out to [facility]. He informed CSW that he would be agreeable to [facility] if accepted.  CSW will continue to follow for discharge planning.      Expected Discharge Plan: [place] Barriers to Discharge: Continued Medical Work up  Expected Discharge Plan and Services Expected Discharge Plan: [place] Choice: [place] arrangements for the past 2 months: Single Family Home Expected Discharge Date: 02/15/19                                     Social Determinants of Health (SDOH) Interventions    Readmission Risk Interventions No flowsheet data found.

## 2019-02-16 NOTE — Progress Notes (Signed)
PROGRESS NOTE    Bryan Paul  TDV:[id number] DOB: Feb 23, 1935 DOA: 02/12/2019 PCP: Marylen Ponto, MD   Brief Narrative:  84 year old with history of atrial flutter, dilated cardiomyopathy, essential hypertension, amiodarone induced hypothyroidism, difficulty hearing and mild dementia came to the hospital while patient had unwitnessed fall after patient's family went on an errand.  They found him on the floor.  He was brought to the hospital for further evaluation.  He was noted to have left intertrochanteric fracture.  Orthopedic team was consulted.   Assessment & Plan:   Principal Problem:   Closed comminuted intertrochanteric fracture of proximal end of left femur, initial encounter (HCC) Active Problems:   CONDUCTIVE HEARING LOSS BILATERAL   Atrial flutter (HCC)   Dilated cardiomyopathy (HCC)   CHF (congestive heart failure) (HCC)   Hyperthyroidism   Essential hypertension   Fall at home, initial encounter   Hip fracture (HCC)   Protein-calorie malnutrition, severe  Fall leading to left intertrochanteric fracture of his hip S/p intramedullary nail to intertrochanteric left femur on 7/9; stable - Per Dr Roda Shutters Lovenox for 2 weeks followed by ASA 81mg  BID for 4 weeks.  -PT/OT, pain control, bowel regimen encourage using IS  Dilated cardiomyopathy - Echocardiogram from 01/30/2019 showed ejection fraction 60 to 65%. -Continue supportive care.  History of atrial flutter; stable -Not on anticoagulation due to risk of falling.  On aspirin.  Status post cardioversion in June 2019.  Unable to tolerate amiodarone.  Currently on Toprol.  Bilateral conductive hearing loss, chronic -Supportive care.  Hyperthyroidism -Follow-up thyroid studies.  Continue home meds  Essential hypertension -On metoprolol.  DVT prophylaxis: None preoperatively, postop per surgical team Code Status: DNR Family Communication: None at bedside.  Disposition Plan: awaiting SNF placement.    Consultants:   Orthopedic  Procedures:   Status post ORIF 7/9  Antimicrobials:   Preop   Subjective: Communicates well with the use of dry erase board. No complaints. Tolerating PO.   Review of Systems Otherwise negative except as per HPI, including: General = no fevers, chills, dizziness, malaise, fatigue HEENT/EYES = negative for pain, redness, loss of vision, double vision, blurred vision, loss of hearing, sore throat, hoarseness, dysphagia Cardiovascular= negative for chest pain, palpitation, murmurs, lower extremity swelling Respiratory/lungs= negative for shortness of breath, cough, hemoptysis, wheezing, mucus production Gastrointestinal= negative for nausea, vomiting,, abdominal pain, melena, hematemesis Genitourinary= negative for Dysuria, Hematuria, Change in Urinary Frequency MSK = Negative for arthralgia, myalgias, Back Pain, Joint swelling  Neurology= Negative for headache, seizures, numbness, tingling  Psychiatry= Negative for anxiety, depression, suicidal and homocidal ideation Allergy/Immunology= Medication/Food allergy as listed  Skin= Negative for Rash, lesions, ulcers, itching   Objective: Vitals:   02/15/19 1719 02/15/19 2000 02/16/19 0330 02/16/19 0813  BP: (!) 107/57 120/63 106/64 139/81  Pulse: 77 75 66 82  Resp:  17 17   Temp: 98.2 F (36.8 C) 98.7 F (37.1 C) 98 F (36.7 C) 98 F (36.7 C)  TempSrc: Oral Oral Oral Oral  SpO2: 99% 99% 98% 97%    Intake/Output Summary (Last 24 hours) at 02/16/2019 0944 Last data filed at 02/16/2019 0800 Gross per 24 hour  Intake 620 ml  Output 200 ml  Net 420 ml   There were no vitals filed for this visit.  Examination: Constitutional: NAD, calm, comfortable Eyes: PERRL, lids and conjunctivae normal ENMT: Mucous membranes are moist. Posterior pharynx clear of any exudate or lesions.Normal dentition.  Neck: normal, supple, no masses, no thyromegaly Respiratory: clear to  auscultation bilaterally, no  wheezing, no crackles. Normal respiratory effort. No accessory muscle use.  Cardiovascular: Regular rate and rhythm, no murmurs / rubs / gallops. No extremity edema. 2+ pedal pulses. No carotid bruits.  Abdomen: no tenderness, no masses palpated. No hepatosplenomegaly. Bowel sounds positive.  Musculoskeletal: no clubbing / cyanosis. No joint deformity upper and lower extremities. Good ROM, no contractures. Normal muscle tone.  Skin: Left hip dressing noted- no infection or bleeding noted Neurologic: CN 2-12 grossly intact. Sensation intact, DTR normal. Strength 5/5 in all 4.  Psychiatric: Normal judgment and insight. Alert and oriented x 3. Normal mood.   Data Reviewed:   CBC: Recent Labs  Lab 02/12/19 1720 02/13/19 0241 02/14/19 0242 02/15/19 0248 02/16/19 0423  WBC 10.0 10.3 16.5* 8.2 7.1  NEUTROABS 7.6  --   --   --   --   HGB 15.0 14.8 12.6* 10.5* 10.1*  HCT 44.9 43.3 37.1* 32.0* 30.7*  MCV 88.7 86.3 86.9 89.4 89.0  PLT 269 309 296 255 096   Basic Metabolic Panel: Recent Labs  Lab 02/12/19 1720 02/13/19 0241 02/14/19 0242 02/15/19 0248 02/16/19 0423  NA 134* 133* 136 134* 134*  K 3.8 4.1 4.3 3.9 4.3  CL 101 101 105 103 104  CO2 18* 22 21* 21* 22  GLUCOSE 118* 132* 145* 83 90  BUN 28* 25* 23 23 22   CREATININE 0.82 0.86 0.78 0.69 0.65  CALCIUM 8.5* 8.7* 8.4* 7.9* 8.0*  MG  --   --  1.9 1.9 1.9   GFR: Estimated Creatinine Clearance: 61.9 mL/min (by C-G formula based on SCr of 0.65 mg/dL). Liver Function Tests: Recent Labs  Lab 02/12/19 2206  ALBUMIN 3.4*   No results for input(s): LIPASE, AMYLASE in the last 168 hours. No results for input(s): AMMONIA in the last 168 hours. Coagulation Profile: Recent Labs  Lab 02/12/19 1720  INR 1.1   Cardiac Enzymes: No results for input(s): CKTOTAL, CKMB, CKMBINDEX, TROPONINI in the last 168 hours. BNP (last 3 results) Recent Labs    01/03/19 1710  PROBNP 944*   HbA1C: No results for input(s): HGBA1C in the last  72 hours. CBG: No results for input(s): GLUCAP in the last 168 hours. Lipid Profile: No results for input(s): CHOL, HDL, LDLCALC, TRIG, CHOLHDL, LDLDIRECT in the last 72 hours. Thyroid Function Tests: No results for input(s): TSH, T4TOTAL, FREET4, T3FREE, THYROIDAB in the last 72 hours. Anemia Panel: No results for input(s): VITAMINB12, FOLATE, FERRITIN, TIBC, IRON, RETICCTPCT in the last 72 hours. Sepsis Labs: No results for input(s): PROCALCITON, LATICACIDVEN in the last 168 hours.  Recent Results (from the past 240 hour(s))  SARS Coronavirus 2 (CEPHEID - Performed in [facility] hospital lab), Hosp Order     Status: None   Collection Time: 02/12/19  5:20 PM   Specimen: Nasopharyngeal Swab  Result Value Ref Range Status   SARS Coronavirus 2 NEGATIVE NEGATIVE Final    Comment: (NOTE) If result is NEGATIVE SARS-CoV-2 target nucleic acids are NOT DETECTED. The SARS-CoV-2 RNA is generally detectable in upper and lower  respiratory specimens during the acute phase of infection. The lowest  concentration of SARS-CoV-2 viral copies this assay can detect is 250  copies / mL. A negative result does not preclude SARS-CoV-2 infection  and should not be used as the sole basis for treatment or other  patient management decisions.  A negative result may occur with  improper specimen collection / handling, submission of specimen other  than nasopharyngeal swab,  presence of viral mutation(s) within the  areas targeted by this assay, and inadequate number of viral copies  (<250 copies / mL). A negative result must be combined with clinical  observations, patient history, and epidemiological information. If result is POSITIVE SARS-CoV-2 target nucleic acids are DETECTED. The SARS-CoV-2 RNA is generally detectable in upper and lower  respiratory specimens dur ing the acute phase of infection.  Positive  results are indicative of active infection with SARS-CoV-2.  Clinical  correlation with  patient history and other diagnostic information is  necessary to determine patient infection status.  Positive results do  not rule out bacterial infection or co-infection with other viruses. If result is PRESUMPTIVE POSTIVE SARS-CoV-2 nucleic acids MAY BE PRESENT.   A presumptive positive result was obtained on the submitted specimen  and confirmed on repeat testing.  While 2019 novel coronavirus  (SARS-CoV-2) nucleic acids may be present in the submitted sample  additional confirmatory testing may be necessary for epidemiological  and / or clinical management purposes  to differentiate between  SARS-CoV-2 and other Sarbecovirus currently known to infect humans.  If clinically indicated additional testing with an alternate test  methodology [id number]) is advised. The SARS-CoV-2 RNA is generally  detectable in upper and lower respiratory sp ecimens during the acute  phase of infection. The expected result is Negative. Fact Sheet for Patients:  BoilerBrush.com.cyhttps://www.fda.gov/media/136312/download Fact Sheet for Healthcare Providers: https://pope.com/https://www.fda.gov/media/136313/download This test is not yet approved or cleared by the [place] FDA and has been authorized for detection and/or diagnosis of SARS-CoV-2 by FDA under an Emergency Use Authorization (EUA).  This EUA will remain in effect (meaning this test can be used) for the duration of the COVID-19 declaration under Section 564(b)(1) of the Act, 21 U.S.C. section 360bbb-3(b)(1), unless the authorization is terminated or revoked sooner. Performed at [facility], [address] [place], [place] [address]   Surgical pcr screen     Status: Abnormal   Collection Time: 02/12/19 11:15 PM   Specimen: Nasal Mucosa; Nasal Swab  Result Value Ref Range Status   MRSA, PCR POSITIVE (A) NEGATIVE Final   Staphylococcus aureus POSITIVE (A) NEGATIVE Final    Comment: RESULT CALLED TO, READ BACK BY AND VERIFIED WITH: Corky DownsS [place]ORN RN  02/13/19 0055 JDW (NOTE) The Xpert SA Assay (FDA approved for NASAL specimens in patients 82 years of age and older), is one component of a comprehensive surveillance program. It is not intended to diagnose infection nor to guide or monitor treatment. Performed at [facility], [address] [place], [place] [address]          Radiology Studies: No results found.      Scheduled Meds: . cholecalciferol  1,000 Units Oral Daily  . citalopram  20 mg Oral Daily  . docusate sodium  100 mg Oral BID  . enoxaparin (LOVENOX) injection  40 mg Subcutaneous Q24H  . methimazole  15 mg Oral Daily  . metoprolol tartrate  2.5 mg Intravenous Q12H  . mupirocin ointment  1 application Nasal BID   Continuous Infusions: . sodium chloride 75 mL/hr at 02/16/19 0544  . lactated ringers 10 mL/hr at 02/13/19 1406  . methocarbamol (ROBAXIN) IV       LOS: 4 days   Time spent= 25 mins    Ankit Joline Maxcyhirag Amin, MD [facility]  If 7PM-7AM, please contact night-coverage [email or web address] 02/16/2019, 9:44 AM

## 2019-02-17 LAB — CBC
HCT: 29.9 % — ABNORMAL LOW (ref 39.0–52.0)
Hemoglobin: 9.9 g/dL — ABNORMAL LOW (ref 13.0–17.0)
MCH: 29.5 pg (ref 26.0–34.0)
MCHC: 33.1 g/dL (ref 30.0–36.0)
MCV: 89 fL (ref 80.0–100.0)
Platelets: 280 10*3/uL (ref 150–400)
RBC: 3.36 MIL/uL — ABNORMAL LOW (ref 4.22–5.81)
RDW: 13 % (ref 11.5–15.5)
WBC: 5.1 10*3/uL (ref 4.0–10.5)
nRBC: 0 % (ref 0.0–0.2)

## 2019-02-17 LAB — BASIC METABOLIC PANEL
Anion gap: 6 (ref 5–15)
BUN: 16 mg/dL (ref 8–23)
CO2: 25 mmol/L (ref 22–32)
Calcium: 7.8 mg/dL — ABNORMAL LOW (ref 8.9–10.3)
Chloride: 104 mmol/L (ref 98–111)
Creatinine, Ser: 0.67 mg/dL (ref 0.61–1.24)
GFR calc Af Amer: >60 mL/min (ref >60)
GFR calc non Af Amer: >60 mL/min (ref >60)
Glucose, Bld: 90 mg/dL (ref 70–99)
Potassium: 4.1 mmol/L (ref 3.5–5.1)
Sodium: 135 mmol/L (ref 135–145)

## 2019-02-17 LAB — MAGNESIUM: Magnesium: 1.8 mg/dL (ref 1.7–2.4)

## 2019-02-17 MED ORDER — ALPRAZOLAM 0.5 MG PO TABS: 0.5000 mg | ORAL_TABLET | Freq: Two times a day (BID) | ORAL | 0 refills | Status: AC | PRN

## 2019-02-17 MED ORDER — POLYETHYLENE GLYCOL 3350 17 G PO PACK: 17.0000 g | PACK | Freq: Every day | ORAL | 0 refills | Status: AC | PRN

## 2019-02-17 MED ORDER — SENNOSIDES-DOCUSATE SODIUM 8.6-50 MG PO TABS: 2.0000 | ORAL_TABLET | Freq: Every evening | ORAL | 1 refills | Status: AC | PRN

## 2019-02-17 NOTE — Plan of Care (Signed)
  Problem: Activity: Goal: Ability to ambulate and perform ADLs will improve Outcome: Progressing   Problem: Clinical Measurements: Goal: Postoperative complications will be avoided or minimized Outcome: Progressing   Problem: Self-Concept: Goal: Ability to maintain and perform role responsibilities to the fullest extent possible will improve Outcome: Progressing   Problem: Pain Management: Goal: Pain level will decrease Outcome: Progressing   

## 2019-02-17 NOTE — Care Management Important Message (Signed)
Important Message  Patient Details  Name: Bryan Paul MRN: [id number] Date of Birth: August 26, 1934   Medicare Important Message Given:  Yes     Memory Argue 02/17/2019, 3:56 PM

## 2019-02-17 NOTE — Progress Notes (Signed)
Physical Therapy Treatment Patient Details Name: Bryan Paul MRN: [id number] DOB: [birth year] Today's Date: 02/17/2019    History of Present Illness 84 yo male with onset of fall and resulting L closed comminuted intertrochanteric fracture was referred to rehab after IM nailing, WBAT.  Pt was home alone per chart, had unwitnessed fall.  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  PMHx:  dilated cardiomyopathy, a-flutter, CHF, malnutrition, HTN, conductive hearing loss,     PT Comments    Pt performed supine LE strengthening and progression to standing with RW and taking steps toward his chair.  He is progressing slowly but making good gains.  He required mod to max assistance at this time and continues to benefit from skilled rehab in a post acute setting to improve strength and function.    Follow Up Recommendations  SNF     Equipment Recommendations  None recommended by PT    Recommendations for Other Services       Precautions / Restrictions Precautions Precautions: Fall Precaution Comments: Deaf use white board to communicate, read lips well Restrictions Weight Bearing Restrictions: No LLE Weight Bearing: Weight bearing as tolerated    Mobility  Bed Mobility Overal bed mobility: Needs Assistance Bed Mobility: Supine to Sit     Supine to sit: Mod assist     General bed mobility comments: Pt required assistance to advance LEs to edge of bed and to elevate trunk into sitting.  Pt is slow and guarded with posterior listing.  Once scooted edge of bed he is able to maintain sitting balance unsupported.  Transfers Overall transfer level: Needs assistance Equipment used: Rolling walker (2 wheeled) Transfers: Sit to/from Stand Sit to Stand: Mod assist         General transfer comment: Cues for hand placement and forward weight shifting to push into standing.  Pt required moderate assistance to boost into standing.  Pt  with slightly flexed posture and tactile cues  for hand placement.  Ambulation/Gait Ambulation/Gait assistance: Max assist Gait Distance (Feet): 4 Feet Assistive device: Rolling walker (2 wheeled) Gait Pattern/deviations: Trunk flexed;Shuffle;Decreased stride length;Decreased step length - left;Decreased stance time - right     General Gait Details: Pt able to take 4-5 steps toward chair with max assistance to maintain standing and position of RW while taking steps to chair.   Stairs             Wheelchair Mobility    Modified Rankin (Stroke Patients Only)       Balance Overall balance assessment: History of Falls;Needs assistance Sitting-balance support: Feet supported;Bilateral upper extremity supported Sitting balance-Leahy Scale: Poor Sitting balance - Comments: tends to lean forward off bedside and requires cues to sit upright   Standing balance support: Bilateral upper extremity supported;During functional activity Standing balance-Leahy Scale: Poor Standing balance comment: External assistance to maintain standing.  BUE support from RW                            Cognition Arousal/Alertness: Awake/alert Behavior During Therapy: Flat affect;WFL for tasks assessed/performed Overall Cognitive Status: Within Functional Limits for tasks assessed                                 General Comments: Pt able to follow commands and asks appropriate questions.  Did not assess further cognition as a great deal of time was spent on communicating commands  for PT tx.  He is extremely HOH/deaf.  I'm not sure he can hear anything at all.      Exercises General Exercises - Lower Extremity Ankle Circles/Pumps: AROM;Both;20 reps;Supine Quad Sets: AROM;Left;10 reps;Supine Heel Slides: AAROM;10 reps;Left;Supine Hip ABduction/ADduction: AAROM;10 reps;Left;Supine Straight Leg Raises: 10 reps;Left;AAROM;Supine    General Comments        Pertinent Vitals/Pain Pain Assessment: Faces Faces Pain Scale:  Hurts even more Pain Location: left hip Pain Descriptors / Indicators: Grimacing;Discomfort;Aching Pain Intervention(s): Monitored during session;Repositioned    Home Living                      Prior Function            PT Goals (current goals can now be found in the care plan section) Acute Rehab PT Goals Patient Stated Goal: none stated PT Goal Formulation: Patient unable to participate in goal setting Potential to Achieve Goals: Good Progress towards PT goals: Progressing toward goals    Frequency    Min 3X/week      PT Plan Current plan remains appropriate    Co-evaluation              AM-PAC PT "6 Clicks" Mobility   Outcome Measure    Help needed moving from lying on your back to sitting on the side of a flat bed without using bedrails?: Total Help needed moving to and from a bed to a chair (including a wheelchair)?: Total Help needed standing up from a chair using your arms (e.g., wheelchair or bedside chair)?: Total Help needed to walk in hospital room?: Total Help needed climbing 3-5 steps with a railing? : Total 6 Click Score: 5    End of Session Equipment Utilized During Treatment: Gait belt Activity Tolerance: Patient tolerated treatment well;Patient limited by fatigue;Treatment limited secondary to medical complications (Comment) Patient left: in chair;with call bell/phone within reach;with chair alarm set Nurse Communication: Mobility status PT Visit Diagnosis: Unsteadiness on feet (R26.81);Muscle weakness (generalized) (M62.81);Difficulty in walking, not elsewhere classified (R26.2);Pain Pain - Right/Left: Left Pain - part of body: Hip     Time: 1010-1034 PT Time Calculation (min) (ACUTE ONLY): 24 min  Charges:  $Gait Training: 8-22 mins $Therapeutic Exercise: 8-22 mins                     Joycelyn Rua, PTA Acute Rehabilitation Services Pager [phone] Office [phone]     Bryan Paul Artis Delay 02/17/2019, 10:56 AM

## 2019-02-17 NOTE — Progress Notes (Signed)
Physical Therapy Treatment Patient Details Name: Bryan Paul MRN: [id number] DOB: 04/16/1935 Today's Date: 02/17/2019    History of Present Illness 84 yo male with onset of fall and resulting L closed comminuted intertrochanteric fracture was referred to rehab after IM nailing, WBAT.  Pt was home alone per chart, had unwitnessed fall.  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  PMHx:  dilated cardiomyopathy, a-flutter, CHF, malnutrition, HTN, conductive hearing loss,     PT Comments    Assisted patient back to bed as he was sliding forward in recliner and had sat up for several hours.  Utilized sara stedy as patient more fatigued this pm.  He required +2 mod assistance to achieve standing and return back to bed.  Plan remains for SNF placement with acute PT follow up.    Follow Up Recommendations  SNF     Equipment Recommendations  None recommended by PT    Recommendations for Other Services       Precautions / Restrictions Precautions Precautions: Fall Precaution Comments: Deaf use white board to communicate, read lips well Restrictions Weight Bearing Restrictions: No LLE Weight Bearing: Weight bearing as tolerated    Mobility  Bed Mobility   Bed Mobility: Sit to Supine       Sit to supine: Mod assist;+2 for physical assistance   General bed mobility comments: +2 to lift B LEs back to bed and lower trunk.  Transfers Overall transfer level: Needs assistance Equipment used: Ambulation equipment used(sara stedy for back to bed.) Transfers: Sit to/from Stand Sit to Stand: Mod assist;+2 physical assistance         General transfer comment: Mod +2 to achieve standing with sara stedy for back to bed.  PTA assisted nursing.  Ambulation/Gait                 Stairs             Wheelchair Mobility    Modified Rankin (Stroke Patients Only)       Balance Overall balance assessment: History of Falls;Needs assistance Sitting-balance support:  Feet supported;Bilateral upper extremity supported Sitting balance-Leahy Scale: Poor Sitting balance - Comments: tends to lean forward off bedside and requires cues to sit upright     Standing balance-Leahy Scale: Poor                              Cognition Arousal/Alertness: Awake/alert Behavior During Therapy: Flat affect;WFL for tasks assessed/performed Overall Cognitive Status: Within Functional Limits for tasks assessed                                        Exercises      General Comments        Pertinent Vitals/Pain Pain Assessment: No/denies pain    Home Living                      Prior Function            PT Goals (current goals can now be found in the care plan section) Acute Rehab PT Goals Patient Stated Goal: none stated PT Goal Formulation: Patient unable to participate in goal setting Potential to Achieve Goals: Good Progress towards PT goals: Progressing toward goals    Frequency    Min 3X/week      PT Plan Current plan remains  appropriate    Co-evaluation              AM-PAC PT "6 Clicks" Mobility   Outcome Measure  Help needed turning from your back to your side while in a flat bed without using bedrails?: A Lot Help needed moving from lying on your back to sitting on the side of a flat bed without using bedrails?: A Lot Help needed moving to and from a bed to a chair (including a wheelchair)?: A Lot Help needed standing up from a chair using your arms (e.g., wheelchair or bedside chair)?: Total Help needed to walk in hospital room?: Total Help needed climbing 3-5 steps with a railing? : Total 6 Click Score: 9    End of Session Equipment Utilized During Treatment: Gait belt Activity Tolerance: Patient tolerated treatment well;Patient limited by fatigue;Treatment limited secondary to medical complications (Comment) Patient left: in chair;with call bell/phone within reach;with chair alarm  set Nurse Communication: Mobility status PT Visit Diagnosis: Unsteadiness on feet (R26.81);Muscle weakness (generalized) (M62.81);Difficulty in walking, not elsewhere classified (R26.2);Pain Pain - Right/Left: Left Pain - part of body: Hip     Time: 1449-1459 PT Time Calculation (min) (ACUTE ONLY): 10 min  Charges:  $Therapeutic Activity: 8-22 mins                     Joycelyn Rua, PTA Acute Rehabilitation Services Pager [phone] Office [phone]     Ofelia Podolski Artis Delay 02/17/2019, 3:05 PM

## 2019-02-17 NOTE — Discharge Summary (Signed)
Physician Discharge Summary  Bryan Paul EAV:[id number] DOB: 19-Jul-1935 DOA: 02/12/2019  PCP: Marylen PontoHolt, Lynley S, MD  Admit date: 02/12/2019 Discharge date: 02/17/2019  Admitted From: Home Disposition: Collapse  Recommendations for Outpatient Follow-up:  1. Follow up with PCP in 1-2 weeks 2. Please obtain BMP/CBC in one week your next doctors visit.  3. Follow-up with orthopedics 2 weeks postoperatively 4. Lovenox subcutaneous 3 weeks followed by aspirin 81 mg twice daily for 4 weeks 5. Pain medication without regimen prescribed 6. Encourage incentive spirometer use   Discharge Condition: Stable CODE STATUS: DNR Diet recommendation: Heart healthy  Brief/Interim Summary: 84 year old with history of atrial flutter, dilated cardiomyopathy, essential hypertension, amiodarone induced hypothyroidism, difficulty hearing and mild dementia came to the hospital while patient had unwitnessed fall after patient's family went on an errand.  They found him on the floor.  He was brought to the hospital for further evaluation.  He was noted to have left intertrochanteric fracture.  Orthopedic team was consulted.  Underwent surgical repair on 7/9 tolerated the procedure well.  Orthopedic recommended Lovenox for 2 weeks followed by 81 mg aspirin twice daily for 4 weeks for DVT prophylaxis. Stable for discharge.   Discharge Diagnoses:  Principal Problem:   Closed comminuted intertrochanteric fracture of proximal end of left femur, initial encounter (HCC) Active Problems:   CONDUCTIVE HEARING LOSS BILATERAL   Atrial flutter (HCC)   Dilated cardiomyopathy (HCC)   CHF (congestive heart failure) (HCC)   Hyperthyroidism   Essential hypertension   Fall at home, initial encounter   Hip fracture (HCC)   Protein-calorie malnutrition, severe  Fall leading to left intertrochanteric fracture of his hip S/p intramedullary nail to intertrochanteric left femur on 7/9; stable - Per Dr Roda ShuttersXu= Lovenox for 2 weeks  followed by ASA 81mg  BID for 4 weeks.  Follow-up postoperatively with orthopedic in 2 weeks -Physical therapy recommending skilled nursing facility.  Arrangements to be made.  Pain control and bowel regimen. encourage using IS  Dilated cardiomyopathy - Echocardiogram from 01/30/2019 showed ejection fraction 60 to 65%. -Continue supportive care.  History of atrial flutter; stable -Not on anticoagulation due to risk of falling.  On aspirin.  Status post cardioversion in June 2019.  Unable to tolerate amiodarone.  Currently on Toprol.  Bilateral conductive hearing loss, chronic -Supportive care.  Hyperthyroidism -Follow-up thyroid studies.  Continue home meds  Essential hypertension -On metoprolol.  Stable for discharge when bed available  Consultations:  Orthopedic  Subjective: Hard of hearing, communicates effectively on dry erase board.  Doing well, no complaints.  Discharge Exam: Vitals:   02/17/19 0511 02/17/19 0915  BP: 133/78 125/77  Pulse: 72 75  Resp: 18 16  Temp: 98.5 F (36.9 C) 97.8 F (36.6 C)  SpO2: 100% 98%   Vitals:   02/16/19 1639 02/16/19 2008 02/17/19 0511 02/17/19 0915  BP: (!) 144/72 (!) 150/78 133/78 125/77  Pulse: 76 79 72 75  Resp:  18 18 16   Temp: 98.2 F (36.8 C) 98.7 F (37.1 C) 98.5 F (36.9 C) 97.8 F (36.6 C)  TempSrc: Oral Oral Oral Oral  SpO2: 99% 97% 100% 98%    General: Pt is alert, awake, not in acute distress Cardiovascular: RRR, S1/S2 +, no rubs, no gallops Respiratory: CTA bilaterally, no wheezing, no rhonchi Abdominal: Soft, NT, ND, bowel sounds + Extremities: no edema, no cyanosis Hip dressing noted without any evidence of bleeding or infection.  Discharge Instructions  Discharge Instructions    Call MD for:  persistant dizziness  or light-headedness   Complete by: As directed    Call MD for:  severe uncontrolled pain   Complete by: As directed    Diet - low sodium heart healthy   Complete by: As directed     Discharge instructions   Complete by: As directed    Take subcutaneous Lovenox as prescribed total of 2 weeks postoperatively followed by aspirin 81 mg twice daily for 4 weeks.  You were cared for by a hospitalist during your hospital stay. If you have any questions about your discharge medications or the care you received while you were in the hospital after you are discharged, you can call the unit and asked to speak with the hospitalist on call if the hospitalist that took care of you is not available. Once you are discharged, your primary care physician will handle any further medical issues. Please note that NO REFILLS for any discharge medications will be authorized once you are discharged, as it is imperative that you return to your primary care physician (or establish a relationship with a primary care physician if you do not have one) for your aftercare needs so that they can reassess your need for medications and monitor your lab values.  Please request your Prim.MD to go over all Hospital Tests and Procedure/Radiological results at the follow up, please get all Hospital records sent to your Prim MD by signing hospital release before you go home.  Get CBC, CMP, 2 view Chest X ray checked  by Primary MD during your next visit or SNF MD in 5-7 days ( we routinely change or add medications that can affect your baseline labs and fluid status, therefore we recommend that you get the mentioned basic workup next visit with your PCP, your PCP may decide not to get them or add new tests based on their clinical decision)  On your next visit with your primary care physician please Get Medicines reviewed and adjusted.  If you experience worsening of your admission symptoms, develop shortness of breath, life threatening emergency, suicidal or homicidal thoughts you must seek medical attention immediately by calling 911 or calling your MD immediately  if symptoms less severe.  You Must read complete  instructions/literature along with all the possible adverse reactions/side effects for all the Medicines you take and that have been prescribed to you. Take any new Medicines after you have completely understood and accpet all the possible adverse reactions/side effects.   Do not drive, operate heavy machinery, perform activities at heights, swimming or participation in water activities or provide baby sitting services if your were admitted for syncope or siezures until you have seen by Primary MD or a Neurologist and advised to do so again.  Do not drive when taking Pain medications.   Increase activity slowly   Complete by: As directed    Weight bearing as tolerated   Complete by: As directed      Allergies as of 02/17/2019   No Known Allergies     Medication List    STOP taking these medications   LORazepam 0.5 MG tablet Commonly known as: ATIVAN Replaced by: ALPRAZolam 0.5 MG tablet   TYLENOL 500 MG tablet Generic drug: acetaminophen     TAKE these medications   ALPRAZolam 0.5 MG tablet Commonly known as: XANAX Take 1 tablet (0.5 mg total) by mouth 2 (two) times daily as needed for up to 5 days for anxiety. Replaces: LORazepam 0.5 MG tablet   aspirin EC 81 MG tablet Take  1 tablet (81 mg total) by mouth daily.   citalopram 10 MG tablet Commonly known as: CELEXA Take 20 mg by mouth daily.   enoxaparin 40 MG/0.4ML injection Commonly known as: LOVENOX Inject 0.4 mLs (40 mg total) into the skin daily.   HYDROcodone-acetaminophen 7.5-325 MG tablet Commonly known as: Norco Take 1-2 tablets by mouth 3 (three) times daily as needed for moderate pain.   methimazole 10 MG tablet Commonly known as: TAPAZOLE Take 1.5 tablets (15 mg total) by mouth daily.   metoprolol succinate 25 MG 24 hr tablet Commonly known as: TOPROL-XL Take 25 mg by mouth daily.   polyethylene glycol 17 g packet Commonly known as: MIRALAX / GLYCOLAX Take 17 g by mouth daily as needed for moderate  constipation or severe constipation.   senna-docusate 8.6-50 MG tablet Commonly known as: Senokot-S Take 2 tablets by mouth at bedtime as needed for mild constipation or moderate constipation.            Discharge Care Instructions  (From admission, onward)         Start     Ordered   02/13/19 0000  Weight bearing as tolerated     02/13/19 1612         Follow-up Information    Leandrew Koyanagi, MD In 2 weeks.   Specialty: Orthopedic Surgery Why: For suture removal, For wound re-check Contact information: [place] [id number] [phone]        Ronita Hipps, MD. Schedule an appointment as soon as possible for a visit in 2 week(s).   Specialty: Family Medicine Contact information: [place] [phone]          No Known Allergies  You were cared for by a hospitalist during your hospital stay. If you have any questions about your discharge medications or the care you received while you were in the hospital after you are discharged, you can call the unit and asked to speak with the hospitalist on call if the hospitalist that took care of you is not available. Once you are discharged, your primary care physician will handle any further medical issues. Please note that no refills for any discharge medications will be authorized once you are discharged, as it is imperative that you return to your primary care physician (or establish a relationship with a primary care physician if you do not have one) for your aftercare needs so that they can reassess your need for medications and monitor your lab values.   Procedures/Studies: Dg Chest 1 View  Result Date: 02/12/2019 CLINICAL DATA:  Fall EXAM: CHEST  1 VIEW COMPARISON:  Radiograph 02/04/2018, CT chest 01/11/2016 FINDINGS: The heart size and mediastinal contours are within normal limits. Both lungs are clear. The visualized soft tissues and osseous structures are  unremarkable. IMPRESSION: No active disease. Electronically Signed   By: MD Lovena Le   On: 02/12/2019 17:57   Pelvis Portable  Result Date: 02/13/2019 CLINICAL DATA:  Status post ORIF of proximal left femoral fracture EXAM: PORTABLE PELVIS 1-2 VIEWS COMPARISON:  Intraoperative films from earlier in the same day. FINDINGS: Medullary rod is noted within the proximal left femur. Two fixation screws are noted traversing the femoral neck. Fracture fragments are in near anatomic alignment. IMPRESSION: ORIF of proximal left femoral fracture. Electronically Signed   By: Inez Catalina M.D.   On: 02/13/2019 17:28   Dg C-arm 1-60 Min  Result Date: 02/13/2019 CLINICAL DATA:  ORIF. EXAM:  LEFT FEMUR 2 VIEWS; DG C-ARM 61-120 MIN COMPARISON:  02/12/2019. FINDINGS: ORIF left femur. Hardware intact. Anatomic alignment. 2 minutes 27 seconds fluoroscopy time utilized. IMPRESSION: ORIF left femur.  Anatomic alignment. Electronically Signed   By: Maisie Fus  Register   On: 02/13/2019 16:29   US Thyroid  Result Date: 02/06/2019 CLINICAL DATA:  Hyperthyroidism EXAM: THYROID ULTRASOUND TECHNIQUE: Ultrasound examination of the thyroid gland and adjacent soft tissues was performed. COMPARISON:  None. FINDINGS: Parenchymal Echotexture: Mildly heterogenous Isthmus: 5 mm Right lobe: 5.4 x 2.0 x 2.2 cm Left lobe: 5.3 x 2.2 x 1.6 cm _________________________________________________________ Estimated total number of nodules >/= 1 cm: 1 Number of spongiform nodules >/=  2 cm not described below (TR1): 0 Number of mixed cystic and solid nodules >/= 1.5 cm not described below (TR2): 0 _________________________________________________________ Nodule # 1: Location: Right; Mid Maximum size: 1.5 cm; Other 2 dimensions: 1.3 x 1.1 cm Composition: solid/almost completely solid (2) Echogenicity: isoechoic (1) Shape: not taller-than-wide (0) Margins: ill-defined (0) Echogenic foci: macrocalcifications (1) ACR TI-RADS total points: 4. ACR TI-RADS risk  category: TR4 (4-6 points). ACR TI-RADS recommendations: **Given size (>/= 1.5 cm) and appearance, fine needle aspiration of this moderately suspicious nodule should be considered based on TI-RADS criteria. _________________________________________________________ There is an additional right thyroid inferior pole 7 mm hypoechoic nodule. This would not meet criteria for any biopsy or follow-up. Normal thyroid vascularity No significant left thyroid abnormality.  No regional adenopathy. IMPRESSION: 1.5 cm right mid thyroid TR 4 nodule meets criteria for biopsy as above. Nonspecific mild thyroid heterogeneity.  Normal vascularity. The above is in keeping with the ACR TI-RADS recommendations - J Am Coll Radiol 2017;14:587-595. Electronically Signed   By: Judie Petit.  Shick M.D.   On: 02/06/2019 14:19   Dg Hip Unilat With Pelvis 2-3 Views Left  Result Date: 02/12/2019 CLINICAL DATA:  Fall, left hip pain, shortening of the left leg EXAM: DG HIP (WITH OR WITHOUT PELVIS) 2-3V LEFT COMPARISON:  None. FINDINGS: Highly comminuted intertrochanteric left femur fracture with marked varus angulation and foreshortening across the fracture line medial displacement of the distal fracture fragment by approximately 1 shaft width. Remaining bones of the pelvis are congruent. Femoral heads remain normally located. Degenerative changes present in lumbar spine. There is significant soft tissue swelling of the left hip with effusion. Remaining soft tissues are unremarkable. IMPRESSION: Comminuted, displaced and foreshortened, varus angulated intertrochanteric left femur fracture. Electronically Signed   By: MD Kreg Shropshire   On: 02/12/2019 17:55   Dg Femur Min 2 Views Left  Result Date: 02/13/2019 CLINICAL DATA:  ORIF. EXAM: LEFT FEMUR 2 VIEWS; DG C-ARM 61-120 MIN COMPARISON:  02/12/2019. FINDINGS: ORIF left femur. Hardware intact. Anatomic alignment. 2 minutes 27 seconds fluoroscopy time utilized. IMPRESSION: ORIF left femur.  Anatomic  alignment. Electronically Signed   By: Maisie Fus  Register   On: 02/13/2019 16:29      The results of significant diagnostics from this hospitalization (including imaging, microbiology, ancillary and laboratory) are listed below for reference.     Microbiology: Recent Results (from the past 240 hour(s))  SARS Coronavirus 2 (CEPHEID - Performed in [facility] hospital lab), Hosp Order     Status: None   Collection Time: 02/12/19  5:20 PM   Specimen: Nasopharyngeal Swab  Result Value Ref Range Status   SARS Coronavirus 2 NEGATIVE NEGATIVE Final    Comment: (NOTE) If result is NEGATIVE SARS-CoV-2 target nucleic acids are NOT DETECTED. The SARS-CoV-2 RNA is generally detectable in upper and  lower  respiratory specimens during the acute phase of infection. The lowest  concentration of SARS-CoV-2 viral copies this assay can detect is 250  copies / mL. A negative result does not preclude SARS-CoV-2 infection  and should not be used as the sole basis for treatment or other  patient management decisions.  A negative result may occur with  improper specimen collection / handling, submission of specimen other  than nasopharyngeal swab, presence of viral mutation(s) within the  areas targeted by this assay, and inadequate number of viral copies  (<250 copies / mL). A negative result must be combined with clinical  observations, patient history, and epidemiological information. If result is POSITIVE SARS-CoV-2 target nucleic acids are DETECTED. The SARS-CoV-2 RNA is generally detectable in upper and lower  respiratory specimens dur ing the acute phase of infection.  Positive  results are indicative of active infection with SARS-CoV-2.  Clinical  correlation with patient history and other diagnostic information is  necessary to determine patient infection status.  Positive results do  not rule out bacterial infection or co-infection with other viruses. If result is PRESUMPTIVE  POSTIVE SARS-CoV-2 nucleic acids MAY BE PRESENT.   A presumptive positive result was obtained on the submitted specimen  and confirmed on repeat testing.  While 2019 novel coronavirus  (SARS-CoV-2) nucleic acids may be present in the submitted sample  additional confirmatory testing may be necessary for epidemiological  and / or clinical management purposes  to differentiate between  SARS-CoV-2 and other Sarbecovirus currently known to infect humans.  If clinically indicated additional testing with an alternate test  methodology [phone]) is advised. The SARS-CoV-2 RNA is generally  detectable in upper and lower respiratory sp ecimens during the acute  phase of infection. The expected result is Negative. Fact Sheet for Patients:  BoilerBrush.com.cy Fact Sheet for Healthcare Providers: https://pope.com/ This test is not yet approved or cleared by the [place] FDA and has been authorized for detection and/or diagnosis of SARS-CoV-2 by FDA under an Emergency Use Authorization (EUA).  This EUA will remain in effect (meaning this test can be used) for the duration of the COVID-19 declaration under Section 564(b)(1) of the Act, 21 U.S.C. section 360bbb-3(b)(1), unless the authorization is terminated or revoked sooner. Performed at [facility], [address] [place], [place] [address]   Surgical pcr screen     Status: Abnormal   Collection Time: 02/12/19 11:15 PM   Specimen: Nasal Mucosa; Nasal Swab  Result Value Ref Range Status   MRSA, PCR POSITIVE (A) NEGATIVE Final   Staphylococcus aureus POSITIVE (A) NEGATIVE Final    Comment: RESULT CALLED TO, READ BACK BY AND VERIFIED WITH: Corky Downs RN 02/13/19 0055 JDW (NOTE) The Xpert SA Assay (FDA approved for NASAL specimens in patients 52 years of age and older), is one component of a comprehensive surveillance program. It is not intended to diagnose infection nor  to guide or monitor treatment. Performed at [facility], [address], [place], [place] [address]      Labs: BNP (last 3 results) No results for input(s): BNP in the last 8760 hours. Basic Metabolic Panel: Recent Labs  Lab 02/13/19 0241 02/14/19 0242 02/15/19 0248 02/16/19 0423 02/17/19 0421  NA 133* 136 134* 134* 135  K 4.1 4.3 3.9 4.3 4.1  CL 101 105 103 104 104  CO2 22 21* 21* 22 25  GLUCOSE 132* 145* 83 90 90  BUN 25* 23 23 22 16   CREATININE 0.86  0.78 0.69 0.65 0.67  CALCIUM 8.7* 8.4* 7.9* 8.0* 7.8*  MG  --  1.9 1.9 1.9 1.8   Liver Function Tests: Recent Labs  Lab 02/12/19 2206  ALBUMIN 3.4*   No results for input(s): LIPASE, AMYLASE in the last 168 hours. No results for input(s): AMMONIA in the last 168 hours. CBC: Recent Labs  Lab 02/12/19 1720 02/13/19 0241 02/14/19 0242 02/15/19 0248 02/16/19 0423 02/17/19 0421  WBC 10.0 10.3 16.5* 8.2 7.1 5.1  NEUTROABS 7.6  --   --   --   --   --   HGB 15.0 14.8 12.6* 10.5* 10.1* 9.9*  HCT 44.9 43.3 37.1* 32.0* 30.7* 29.9*  MCV 88.7 86.3 86.9 89.4 89.0 89.0  PLT 269 309 296 255 272 280   Cardiac Enzymes: No results for input(s): CKTOTAL, CKMB, CKMBINDEX, TROPONINI in the last 168 hours. BNP: Invalid input(s): POCBNP CBG: No results for input(s): GLUCAP in the last 168 hours. D-Dimer No results for input(s): DDIMER in the last 72 hours. Hgb A1c No results for input(s): HGBA1C in the last 72 hours. Lipid Profile No results for input(s): CHOL, HDL, LDLCALC, TRIG, CHOLHDL, LDLDIRECT in the last 72 hours. Thyroid function studies No results for input(s): TSH, T4TOTAL, T3FREE, THYROIDAB in the last 72 hours.  Invalid input(s): FREET3 Anemia work up No results for input(s): VITAMINB12, FOLATE, FERRITIN, TIBC, IRON, RETICCTPCT in the last 72 hours. Urinalysis    Component Value Date/Time   COLORURINE yellow 04/21/2009 0928   APPEARANCEUR Clear 04/21/2009 0928   LABSPEC 1.020 04/21/2009 0928    PHURINE 7.0 04/21/2009 0928   HGBUR negative 04/21/2009 0928   BILIRUBINUR n 11/10/2013 1044   PROTEINUR n 11/10/2013 1044   UROBILINOGEN 0.2 11/10/2013 1044   UROBILINOGEN 0.2 04/21/2009 0928   NITRITE n 11/10/2013 1044   NITRITE negative 04/21/2009 0928   LEUKOCYTESUR Negative 11/10/2013 1044   Sepsis Labs Invalid input(s): PROCALCITONIN,  WBC,  LACTICIDVEN Microbiology Recent Results (from the past 240 hour(s))  SARS Coronavirus 2 (CEPHEID - Performed in [facility] hospital lab), Hosp Order     Status: None   Collection Time: 02/12/19  5:20 PM   Specimen: Nasopharyngeal Swab  Result Value Ref Range Status   SARS Coronavirus 2 NEGATIVE NEGATIVE Final    Comment: (NOTE) If result is NEGATIVE SARS-CoV-2 target nucleic acids are NOT DETECTED. The SARS-CoV-2 RNA is generally detectable in upper and lower  respiratory specimens during the acute phase of infection. The lowest  concentration of SARS-CoV-2 viral copies this assay can detect is 250  copies / mL. A negative result does not preclude SARS-CoV-2 infection  and should not be used as the sole basis for treatment or other  patient management decisions.  A negative result may occur with  improper specimen collection / handling, submission of specimen other  than nasopharyngeal swab, presence of viral mutation(s) within the  areas targeted by this assay, and inadequate number of viral copies  (<250 copies / mL). A negative result must be combined with clinical  observations, patient history, and epidemiological information. If result is POSITIVE SARS-CoV-2 target nucleic acids are DETECTED. The SARS-CoV-2 RNA is generally detectable in upper and lower  respiratory specimens dur ing the acute phase of infection.  Positive  results are indicative of active infection with SARS-CoV-2.  Clinical  correlation with patient history and other diagnostic information is  necessary to determine patient infection status.  Positive  results do  not rule out bacterial infection or co-infection with  other viruses. If result is PRESUMPTIVE POSTIVE SARS-CoV-2 nucleic acids MAY BE PRESENT.   A presumptive positive result was obtained on the submitted specimen  and confirmed on repeat testing.  While 2019 novel coronavirus  (SARS-CoV-2) nucleic acids may be present in the submitted sample  additional confirmatory testing may be necessary for epidemiological  and / or clinical management purposes  to differentiate between  SARS-CoV-2 and other Sarbecovirus currently known to infect humans.  If clinically indicated additional testing with an alternate test  methodology [phone] is advised. The SARS-CoV-2 RNA is generally  detectable in upper and lower respiratory sp ecimens during the acute  phase of infection. The expected result is Negative. Fact Sheet for Patients:  BoilerBrush.com.cyhttps://www.fda.gov/media/136312/download Fact Sheet for Healthcare Providers: https://pope.com/https://www.fda.gov/media/136313/download This test is not yet approved or cleared by the [place] FDA and has been authorized for detection and/or diagnosis of SARS-CoV-2 by FDA under an Emergency Use Authorization (EUA).  This EUA will remain in effect (meaning this test can be used) for the duration of the COVID-19 declaration under Section 564(b)(1) of the Act, 21 U.S.C. section 360bbb-3(b)(1), unless the authorization is terminated or revoked sooner. Performed at [facility], [address] [place], [place] [address]   Surgical pcr screen     Status: Abnormal   Collection Time: 02/12/19 11:15 PM   Specimen: Nasal Mucosa; Nasal Swab  Result Value Ref Range Status   MRSA, PCR POSITIVE (A) NEGATIVE Final   Staphylococcus aureus POSITIVE (A) NEGATIVE Final    Comment: RESULT CALLED TO, READ BACK BY AND VERIFIED WITH: Corky DownsS [place] RN 02/13/19 0055 JDW (NOTE) The Xpert SA Assay (FDA approved for NASAL specimens in patients 82 years of age and  older), is one component of a comprehensive surveillance program. It is not intended to diagnose infection nor to guide or monitor treatment. Performed at [facility], [address] [place], [place] [address]      Time coordinating discharge:  I have spent 35 minutes face to face with the patient and on the ward discussing the patients care, assessment, plan and disposition with other care givers. >50% of the time was devoted counseling the patient about the risks and benefits of treatment/Discharge disposition and coordinating care.   SIGNED:   Dimple NanasAnkit Chirag Dewell Monnier, MD  [facility] 02/17/2019, 9:54 AM   If 7PM-7AM, please contact night-coverage [email or web address]

## 2019-02-18 LAB — BASIC METABOLIC PANEL
Anion gap: 7 (ref 5–15)
BUN: 13 mg/dL (ref 8–23)
CO2: 26 mmol/L (ref 22–32)
Calcium: 8 mg/dL — ABNORMAL LOW (ref 8.9–10.3)
Chloride: 100 mmol/L (ref 98–111)
Creatinine, Ser: 0.68 mg/dL (ref 0.61–1.24)
GFR calc Af Amer: >60 mL/min (ref >60)
GFR calc non Af Amer: >60 mL/min (ref >60)
Glucose, Bld: 103 mg/dL — ABNORMAL HIGH (ref 70–99)
Potassium: 4.1 mmol/L (ref 3.5–5.1)
Sodium: 133 mmol/L — ABNORMAL LOW (ref 135–145)

## 2019-02-18 LAB — CBC
HCT: 31.3 % — ABNORMAL LOW (ref 39.0–52.0)
Hemoglobin: 10.6 g/dL — ABNORMAL LOW (ref 13.0–17.0)
MCH: 29.5 pg (ref 26.0–34.0)
MCHC: 33.9 g/dL (ref 30.0–36.0)
MCV: 87.2 fL (ref 80.0–100.0)
Platelets: 300 10*3/uL (ref 150–400)
RBC: 3.59 MIL/uL — ABNORMAL LOW (ref 4.22–5.81)
RDW: 13 % (ref 11.5–15.5)
WBC: 7.1 10*3/uL (ref 4.0–10.5)
nRBC: 0 % (ref 0.0–0.2)

## 2019-02-18 LAB — MAGNESIUM: Magnesium: 1.9 mg/dL (ref 1.7–2.4)

## 2019-02-18 NOTE — Progress Notes (Signed)
RN spoke with pt's daughter and updated her on plan of care. Will call if anything changes.

## 2019-02-18 NOTE — Progress Notes (Signed)
PROGRESS NOTE    Bryan Paul  XAJ:[id number] DOB: 1934-10-28 DOA: 02/12/2019 PCP: Ronita Hipps, MD   Brief Narrative:  84 year old with history of atrial flutter, dilated cardiomyopathy, essential hypertension, amiodarone induced hypothyroidism, difficulty hearing and mild dementia came to the hospital while patient had unwitnessed fall after patient's family went on an errand.  They found him on the floor.  He was brought to the hospital for further evaluation.  He was noted to have left intertrochanteric fracture.  Orthopedic team was consulted.  Underwent surgical repair 7/9 he did well with it.  Physical therapy recommended rehab.   Assessment & Plan:   Principal Problem:   Closed comminuted intertrochanteric fracture of proximal end of left femur, initial encounter ([place]) Active Problems:   CONDUCTIVE HEARING LOSS BILATERAL   Atrial flutter (HCC)   Dilated cardiomyopathy (HCC)   CHF (congestive heart failure) ([place])   Hyperthyroidism   Essential hypertension   Fall at home, initial encounter   Hip fracture ([place])   Protein-calorie malnutrition, severe  Fall leading to left intertrochanteric fracture of his hip S/p intramedullary nail to intertrochanteric left femur on 7/9; stable - Per Dr Erlinda Hong Lovenox for 2 weeks followed by ASA 81mg  BID for 4 weeks.  -Awaiting placement at rehab.  Dilated cardiomyopathy - Echocardiogram from 01/30/2019 showed ejection fraction 60 to 65%. -Continue supportive care.  History of atrial flutter; stable -Not on anticoagulation due to risk of falling.  On aspirin.  Status post cardioversion in June 2019.  Unable to tolerate amiodarone.  Currently on Toprol.  Bilateral conductive hearing loss, chronic -Supportive care.  Hyperthyroidism -Follow-up thyroid studies.  Continue home meds  Essential hypertension -On metoprolol.  DVT prophylaxis: Lovenox Code Status: DNR Family Communication: None at bedside.  Disposition Plan: Awaiting  placement at rehab  Consultants:   Orthopedic  Procedures:   Status post ORIF 7/9  Antimicrobials:   Preop   Subjective: No complaints, doing well.  Very hard of hearing therefore have to communicate using dry erase board at bedside  Review of Systems Otherwise negative except as per HPI, including: General = no fevers, chills, dizziness, malaise, fatigue HEENT/EYES = negative for pain, redness, loss of vision, double vision, blurred vision, loss of hearing, sore throat, hoarseness, dysphagia Cardiovascular= negative for chest pain, palpitation, murmurs, lower extremity swelling Respiratory/lungs= negative for shortness of breath, cough, hemoptysis, wheezing, mucus production Gastrointestinal= negative for nausea, vomiting,, abdominal pain, melena, hematemesis Genitourinary= negative for Dysuria, Hematuria, Change in Urinary Frequency MSK = Negative for arthralgia, myalgias, Back Pain, Joint swelling  Neurology= Negative for headache, seizures, numbness, tingling  Psychiatry= Negative for anxiety, depression, suicidal and homocidal ideation Allergy/Immunology= Medication/Food allergy as listed  Skin= Negative for Rash, lesions, ulcers, itching   Objective: Vitals:   02/17/19 1359 02/17/19 2020 02/18/19 0340 02/18/19 0927  BP: 124/80 (!) 156/65 126/66 (!) 147/71  Pulse: 71 84 73 72  Resp: 16 14 14 17   Temp: 99.1 F (37.3 C) 98.9 F (37.2 C) 98.4 F (36.9 C) 98.5 F (36.9 C)  TempSrc: Oral Oral Oral Oral  SpO2: 97% 100% 99% 100%    Intake/Output Summary (Last 24 hours) at 02/18/2019 1033 Last data filed at 02/18/2019 0300 Gross per 24 hour  Intake 480 ml  Output 1450 ml  Net -970 ml   There were no vitals filed for this visit.  Examination: Constitutional: NAD, calm, comfortable, very hard of hearing Eyes: PERRL, lids and conjunctivae normal ENMT: Mucous membranes are moist. Posterior pharynx clear of  any exudate or lesions.Normal dentition.  Neck: normal,  supple, no masses, no thyromegaly Respiratory: clear to auscultation bilaterally, no wheezing, no crackles. Normal respiratory effort. No accessory muscle use.  Cardiovascular: Regular rate and rhythm, no murmurs / rubs / gallops. No extremity edema. 2+ pedal pulses. No carotid bruits.  Abdomen: no tenderness, no masses palpated. No hepatosplenomegaly. Bowel sounds positive.  Musculoskeletal: no clubbing / cyanosis. No joint deformity upper and lower extremities. Good ROM, no contractures. Normal muscle tone.  Skin: Hip dressing noted without any evidence of bleeding or infection. Neurologic: CN 2-12 grossly intact. Sensation intact, DTR normal. Strength 5/5 in all 4.  Psychiatric: Normal judgment and insight. Alert and oriented x 3. Normal mood.   Data Reviewed:   CBC: Recent Labs  Lab 02/12/19 1720  02/14/19 0242 02/15/19 0248 02/16/19 0423 02/17/19 0421 02/18/19 0256  WBC 10.0   < > 16.5* 8.2 7.1 5.1 7.1  NEUTROABS 7.6  --   --   --   --   --   --   HGB 15.0   < > 12.6* 10.5* 10.1* 9.9* 10.6*  HCT 44.9   < > 37.1* 32.0* 30.7* 29.9* 31.3*  MCV 88.7   < > 86.9 89.4 89.0 89.0 87.2  PLT 269   < > 296 255 272 280 300   < > = values in this interval not displayed.   Basic Metabolic Panel: Recent Labs  Lab 02/14/19 0242 02/15/19 0248 02/16/19 0423 02/17/19 0421 02/18/19 0256  NA 136 134* 134* 135 133*  K 4.3 3.9 4.3 4.1 4.1  CL 105 103 104 104 100  CO2 21* 21* 22 25 26   GLUCOSE 145* 83 90 90 103*  BUN 23 23 22 16 13   CREATININE 0.78 0.69 0.65 0.67 0.68  CALCIUM 8.4* 7.9* 8.0* 7.8* 8.0*  MG 1.9 1.9 1.9 1.8 1.9   GFR: Estimated Creatinine Clearance: 61.9 mL/min (by C-G formula based on SCr of 0.68 mg/dL). Liver Function Tests: Recent Labs  Lab 02/12/19 2206  ALBUMIN 3.4*   No results for input(s): LIPASE, AMYLASE in the last 168 hours. No results for input(s): AMMONIA in the last 168 hours. Coagulation Profile: Recent Labs  Lab 02/12/19 1720  INR 1.1   Cardiac  Enzymes: No results for input(s): CKTOTAL, CKMB, CKMBINDEX, TROPONINI in the last 168 hours. BNP (last 3 results) Recent Labs    01/03/19 1710  PROBNP 944*   HbA1C: No results for input(s): HGBA1C in the last 72 hours. CBG: No results for input(s): GLUCAP in the last 168 hours. Lipid Profile: No results for input(s): CHOL, HDL, LDLCALC, TRIG, CHOLHDL, LDLDIRECT in the last 72 hours. Thyroid Function Tests: No results for input(s): TSH, T4TOTAL, FREET4, T3FREE, THYROIDAB in the last 72 hours. Anemia Panel: No results for input(s): VITAMINB12, FOLATE, FERRITIN, TIBC, IRON, RETICCTPCT in the last 72 hours. Sepsis Labs: No results for input(s): PROCALCITON, LATICACIDVEN in the last 168 hours.  Recent Results (from the past 240 hour(s))  SARS Coronavirus 2 (CEPHEID - Performed in [facility] lab), Hosp Order     Status: None   Collection Time: 02/12/19  5:20 PM   Specimen: Nasopharyngeal Swab  Result Value Ref Range Status   SARS Coronavirus 2 NEGATIVE NEGATIVE Final    Comment: (NOTE) If result is NEGATIVE SARS-CoV-2 target nucleic acids are NOT DETECTED. The SARS-CoV-2 RNA is generally detectable in upper and lower  respiratory specimens during the acute phase of infection. The lowest  concentration of SARS-CoV-2 viral  copies this assay can detect is 250  copies / mL. A negative result does not preclude SARS-CoV-2 infection  and should not be used as the sole basis for treatment or other  patient management decisions.  A negative result may occur with  improper specimen collection / handling, submission of specimen other  than nasopharyngeal swab, presence of viral mutation(s) within the  areas targeted by this assay, and inadequate number of viral copies  (<250 copies / mL). A negative result must be combined with clinical  observations, patient history, and epidemiological information. If result is POSITIVE SARS-CoV-2 target nucleic acids are DETECTED. The  SARS-CoV-2 RNA is generally detectable in upper and lower  respiratory specimens dur ing the acute phase of infection.  Positive  results are indicative of active infection with SARS-CoV-2.  Clinical  correlation with patient history and other diagnostic information is  necessary to determine patient infection status.  Positive results do  not rule out bacterial infection or co-infection with other viruses. If result is PRESUMPTIVE POSTIVE SARS-CoV-2 nucleic acids MAY BE PRESENT.   A presumptive positive result was obtained on the submitted specimen  and confirmed on repeat testing.  While 2019 novel coronavirus  (SARS-CoV-2) nucleic acids may be present in the submitted sample  additional confirmatory testing may be necessary for epidemiological  and / or clinical management purposes  to differentiate between  SARS-CoV-2 and other Sarbecovirus currently known to infect humans.  If clinically indicated additional testing with an alternate test  methodology [phone]) is advised. The SARS-CoV-2 RNA is generally  detectable in upper and lower respiratory sp ecimens during the acute  phase of infection. The expected result is Negative. Fact Sheet for Patients:  BoilerBrush.com.cy Fact Sheet for Healthcare Providers: https://pope.com/ This test is not yet approved or cleared by the [place] FDA and has been authorized for detection and/or diagnosis of SARS-CoV-2 by FDA under an Emergency Use Authorization (EUA).  This EUA will remain in effect (meaning this test can be used) for the duration of the COVID-19 declaration under Section 564(b)(1) of the Act, 21 U.S.C. section 360bbb-3(b)(1), unless the authorization is terminated or revoked sooner. Performed at [facility], [address] [place], [place] [address]   Surgical pcr screen     Status: Abnormal   Collection Time: 02/12/19 11:15 PM   Specimen: Nasal  Mucosa; Nasal Swab  Result Value Ref Range Status   MRSA, PCR POSITIVE (A) NEGATIVE Final   Staphylococcus aureus POSITIVE (A) NEGATIVE Final    Comment: RESULT CALLED TO, READ BACK BY AND VERIFIED WITH: Corky Downs RN 02/13/19 0055 JDW (NOTE) The Xpert SA Assay (FDA approved for NASAL specimens in patients 52 years of age and older), is one component of a comprehensive surveillance program. It is not intended to diagnose infection nor to guide or monitor treatment. Performed at [facility], [address] [place], [place] [address]          Radiology Studies: No results found.      Scheduled Meds: . cholecalciferol  1,000 Units Oral Daily  . citalopram  20 mg Oral Daily  . docusate sodium  100 mg Oral BID  . enoxaparin (LOVENOX) injection  40 mg Subcutaneous Q24H  . methimazole  15 mg Oral Daily  . metoprolol tartrate  2.5 mg Intravenous Q12H   Continuous Infusions: . sodium chloride 75 mL/hr at 02/16/19 0544  . lactated ringers 10 mL/hr at 02/13/19 1406  . methocarbamol (ROBAXIN) IV  LOS: 6 days   Time spent= 15 mins    Rada Zegers Joline Maxcyhirag Theia Dezeeuw, MD [facility]  If 7PM-7AM, please contact night-coverage [email or web address] 02/18/2019, 10:33 AM

## 2019-02-18 NOTE — Progress Notes (Signed)
  Speech Language Pathology Treatment: Dysphagia  Patient Details Name: Bryan Paul MRN: [id number] DOB: 1934-11-14 Today's Date: 02/18/2019 Time: 1035-1050 SLP Time Calculation (min) (ACUTE ONLY): 15 min  Assessment / Plan / Recommendation Clinical Impression  Patient seen to address dysphagia goals. He is currently on regular solids, thin liquids diet and although he is tolerating without any overt s/s of aspiration or penetration, but he reportedly is not eating much at all. SLP observed patient with thin liquids via straw sips but he declined solids. He exhibited timely swallow initiation and no overt s/s of aspiration or penetration. Patient does not require further SLP services as at this point, main issue is limited oral intake.     HPI HPI: 84 yo male with onset of fall and resulting L closed comminuted intertrochanteric fracture was referred to rehab after IM nailing, WBAT.  Pt was home alone per chart, had unwitnessed fall.  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  PMHx:  dilated cardiomyopathy, a-flutter, CHF, malnutrition, HTN, conductive hearing loss. Prior to this hospitalization, patient was declining in PO intake and had significant weight loss. He was intubated during the surgery only.      SLP Plan  Discharge SLP treatment due to (comment)(goals met, patient planned discharge to SNF)       Recommendations  Diet recommendations: Regular;Thin liquid Liquids provided via: Straw;Cup Medication Administration: Whole meds with liquid Supervision: Patient able to self feed Compensations: Minimize environmental distractions;Slow rate;Small sips/bites Postural Changes and/or Swallow Maneuvers: Seated upright 90 degrees                Oral Care Recommendations: Oral care BID Follow up Recommendations: None SLP Visit Diagnosis: Dysphagia, unspecified (R13.10) Plan: Discharge SLP treatment due to (comment)(goals met, patient planned discharge to SNF)        [name], Bryan Paul 02/18/2019, 12:32 PM    Sonia Baller, MA, [facility] Pager: [phone]

## 2019-02-18 NOTE — Progress Notes (Signed)
Physical Therapy Treatment Patient Details Name: Bryan Paul MRN: [id number] DOB: 08/30/1934 Today's Date: 02/18/2019    History of Present Illness 84 yo male with onset of fall and resulting L closed comminuted intertrochanteric fracture was referred to rehab after IM nailing, WBAT.  Pt was home alone per chart, had unwitnessed fall.  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  PMHx:  dilated cardiomyopathy, a-flutter, CHF, malnutrition, HTN, conductive hearing loss,     PT Comments    Pt on arrival with stool incontinence.  He required multiple bouts of rolling to perform pericare.  Pt is slow and guarded and required increased assistance for all aspects of mobility today.  Pt with strong posterior lean and fatiguing quickly due to multiple BMs during session.  He required placement on BSC x2.  Pt continues to benefit from snf placement to improve strength and function.     Follow Up Recommendations  SNF     Equipment Recommendations  None recommended by PT    Recommendations for Other Services       Precautions / Restrictions Precautions Precautions: Fall Precaution Comments: Deaf use white board to communicate, read lips well Restrictions Weight Bearing Restrictions: No LLE Weight Bearing: Weight bearing as tolerated    Mobility  Bed Mobility Overal bed mobility: Needs Assistance Bed Mobility: Supine to Sit;Rolling     Supine to sit: Mod assist Sit to supine: Mod assist;+2 for physical assistance   General bed mobility comments: Pt required assistance to elevate trunk into sitting and move legs to edge of bed.  Rolling performed pre supine to sit to perform pericare as patient was found with stool incontinence.  Transfers Overall transfer level: Needs assistance Equipment used: Rolling walker (2 wheeled) Transfers: Sit to/from Stand Sit to Stand: Max assist;+2 physical assistance Stand pivot transfers: Max assist;+2 physical assistance       General  transfer comment: Increased assistance to perform transfers today.  he appears more fatigue after multiple bouts of BMs during mobility including need for sitting on commode x2.  Ambulation/Gait Ambulation/Gait assistance: Max assist Gait Distance (Feet): 4 Feet(only able to perform short gt trial before he had to sit on commode due to BM.) Assistive device: Rolling walker (2 wheeled) Gait Pattern/deviations: Trunk flexed;Leaning posteriorly     General Gait Details: Limited due to bowel incontinence but patient having difficulty translating weight over his feet and required assistance to maintain balance due to strong posterior lean.   Stairs             Wheelchair Mobility    Modified Rankin (Stroke Patients Only)       Balance Overall balance assessment: History of Falls;Needs assistance Sitting-balance support: Feet supported;Bilateral upper extremity supported Sitting balance-Leahy Scale: Poor Sitting balance - Comments: tends to lean forward off bedside and requires cues to sit upright     Standing balance-Leahy Scale: Poor Standing balance comment: External assistance to maintain standing.  BUE support from RW                            Cognition Arousal/Alertness: Awake/alert Behavior During Therapy: Flat affect;WFL for tasks assessed/performed Overall Cognitive Status: Within Functional Limits for tasks assessed                                 General Comments: Continues to require increased time to understand commands due to hearing impairment.  Exercises      General Comments        Pertinent Vitals/Pain Pain Assessment: Faces Faces Pain Scale: Hurts even more Pain Location: L hip Pain Descriptors / Indicators: Grimacing;Discomfort;Aching Pain Intervention(s): Monitored during session;Limited activity within patient's tolerance    Home Living Family/patient expects to be discharged to:: Skilled nursing  facility Living Arrangements: Children                  Prior Function            PT Goals (current goals can now be found in the care plan section) Acute Rehab PT Goals Patient Stated Goal: none stated Potential to Achieve Goals: Good Progress towards PT goals: Progressing toward goals    Frequency    Min 3X/week      PT Plan Current plan remains appropriate    Co-evaluation              AM-PAC PT "6 Clicks" Mobility   Outcome Measure  Help needed turning from your back to your side while in a flat bed without using bedrails?: A Lot Help needed moving from lying on your back to sitting on the side of a flat bed without using bedrails?: A Lot Help needed moving to and from a bed to a chair (including a wheelchair)?: A Lot Help needed standing up from a chair using your arms (e.g., wheelchair or bedside chair)?: Total Help needed to walk in hospital room?: Total Help needed climbing 3-5 steps with a railing? : Total 6 Click Score: 9    End of Session Equipment Utilized During Treatment: Gait belt Activity Tolerance: Patient limited by fatigue;Treatment limited secondary to medical complications (Comment) Patient left: in chair;with call bell/phone within reach;with chair alarm set Nurse Communication: Mobility status PT Visit Diagnosis: Unsteadiness on feet (R26.81);Muscle weakness (generalized) (M62.81);Difficulty in walking, not elsewhere classified (R26.2);Pain Pain - Right/Left: Left Pain - part of body: Hip     Time: 8270-7867 PT Time Calculation (min) (ACUTE ONLY): 32 min  Charges:  $Therapeutic Activity: 23-37 mins                     Bryan Paul, PTA Acute Rehabilitation Services Pager [phone] Office [phone]     Bryan Paul Bryan Paul 02/18/2019, 1:30 PM

## 2019-02-19 NOTE — Progress Notes (Signed)
OT Cancellation Note  Patient Details Name: Baruch Lewers MRN: [id number] DOB: 1935-07-08   Cancelled Treatment:    Reason Eval/Treat Not Completed: Fatigue/lethargy limiting ability to participate. Pt in deep sleep. Will continue to follow.  Malka So 02/19/2019, 3:41 PM  Nestor Lewandowsky, OTR/L Acute Rehabilitation Services Pager: [phone] Office: [phone]

## 2019-02-19 NOTE — Progress Notes (Signed)
RN spoke with pt's son [name], updated on plan of care. Given medicare number. All questions answered.

## 2019-02-19 NOTE — Progress Notes (Signed)
Bryan Paul has accepted patient, son [name] made aware. [employer] insurance authorization has been initiated.   [place], [place]

## 2019-02-19 NOTE — Discharge Summary (Addendum)
Physician Discharge Summary  Bryan Paul ZOX:[id number] DOB: 01/24/1935 DOA: 02/12/2019 This is a no charge note ---further is d/c summary as per Dr. Nelson Chimes from 7/13 without acute changes PCP: Marylen Ponto, MD  Admit date: 02/12/2019 Discharge date: 02/19/2019  Admitted From: Home Disposition: Collapse  Recommendations for Outpatient Follow-up:  1. Follow up with PCP in 1-2 weeks 2. Please obtain BMP/CBC in one week your next doctors visit.  3. Follow-up with orthopedics 2 weeks postoperatively 4. Lovenox subcutaneous 3 weeks followed by aspirin 81 mg twice daily for 4 weeks 5. Pain medication without regimen prescribed 6. Encourage incentive spirometer use   Discharge Condition: Stable CODE STATUS: DNR Diet recommendation: Heart healthy  Brief/Interim Summary: 84 year old with history of atrial flutter, dilated cardiomyopathy, essential hypertension, amiodarone induced hypothyroidism, difficulty hearing and mild dementia came to the hospital while patient had unwitnessed fall after patient's family went on an errand.  They found him on the floor.  He was brought to the hospital for further evaluation.  He was noted to have left intertrochanteric fracture.  Orthopedic team was consulted.  Underwent surgical repair on 7/9 tolerated the procedure well.  Orthopedic recommended Lovenox for 2 weeks followed by 81 mg aspirin twice daily for 4 weeks for DVT prophylaxis. Stable for discharge.   Discharge Diagnoses:  Principal Problem:   Closed comminuted intertrochanteric fracture of proximal end of left femur, initial encounter (HCC) Active Problems:   CONDUCTIVE HEARING LOSS BILATERAL   Atrial flutter (HCC)   Dilated cardiomyopathy (HCC)   CHF (congestive heart failure) (HCC)   Hyperthyroidism   Essential hypertension   Fall at home, initial encounter   Hip fracture (HCC)   Protein-calorie malnutrition, severe  Fall leading to left intertrochanteric fracture of his hip S/p  intramedullary nail to intertrochanteric left femur on 7/9; stable - Per Dr Roda Shutters Lovenox for 2 weeks followed by ASA  BID for 4 weeks.  Follow-up postoperatively with orthopedic in 2 weeks -Physical therapy recommending skilled nursing facility.  Arrangements to be made.  Pain control and bowel regimen. encourage using IS  Dilated cardiomyopathy - Echocardiogram from 01/30/2019 showed ejection fraction 60 to 65%. -Continue supportive care.  History of atrial flutter; stable, [place] score >4 Has bled score elevated therefore-Not on anticoagulation due to risk of falling.   On aspirin.  Status post cardioversion in June 2019.  Unable to tolerate amiodarone.  Currently on Toprol xl 25  Bilateral conductive hearing loss, chronic -Supportive care.  Hyperthyroidism -Follow-up thyroid studies in the next 3-6 weeks.  Continue Tapazole  Essential hypertension -On metoprolol as above  Stable for discharge when bed available  Consultations:  Orthopedic  Subjective: Doing well Communicated with him with the help of a writing board No fever no chills Was up out of bed yesterday Passing stool Pain is minimal  Discharge Exam: Vitals:   02/19/19 0452 02/19/19 0926  BP: 116/61 124/69  Pulse: 72 63  Resp: 14 16  Temp: 98.7 F (37.1 C) 98 F (36.7 C)  SpO2: 98% 98%   Vitals:   02/18/19 0927 02/18/19 2049 02/19/19 0452 02/19/19 0926  BP: (!) 147/71 131/71 116/61 124/69  Pulse: 72 79 72 63  Resp: Temp: 98.5 F (36.9 C) 98.8 F (37.1 C) 98.7 F (37.1 C) 98 F (36.7 C)  TempSrc: Oral Oral Oral Oral  SpO2: 100% 98% 98% 98%    Awake oriented pleasant extraocular movements intact mild bitemporal wasting S1-S2 no murmur seems to be  sinus rhythm Abdomen soft Wound not examined Neurologically intact to motor moving all 4 limbs  Discharge Instructions  Discharge Instructions    Call MD for:  persistant dizziness or light-headedness   Complete by: As  directed    Call MD for:  severe uncontrolled pain   Complete by: As directed    Diet - low sodium heart healthy   Complete by: As directed    Discharge instructions   Complete by: As directed    Take subcutaneous Lovenox as prescribed total of 2 weeks postoperatively followed by aspirin 81 mg twice daily for 4 weeks.  You were cared for by a hospitalist during your hospital stay. If you have any questions about your discharge medications or the care you received while you were in the hospital after you are discharged, you can call the unit and asked to speak with the hospitalist on call if the hospitalist that took care of you is not available. Once you are discharged, your primary care physician will handle any further medical issues. Please note that NO REFILLS for any discharge medications will be authorized once you are discharged, as it is imperative that you return to your primary care physician (or establish a relationship with a primary care physician if you do not have one) for your aftercare needs so that they can reassess your need for medications and monitor your lab values.  Please request your Prim.MD to go over all Hospital Tests and Procedure/Radiological results at the follow up, please get all Hospital records sent to your Prim MD by signing hospital release before you go home.  Get CBC, CMP, 2 view Chest X ray checked  by Primary MD during your next visit or SNF MD in 5-7 days ( we routinely change or add medications that can affect your baseline labs and fluid status, therefore we recommend that you get the mentioned basic workup next visit with your PCP, your PCP may decide not to get them or add new tests based on their clinical decision)  On your next visit with your primary care physician please Get Medicines reviewed and adjusted.  If you experience worsening of your admission symptoms, develop shortness of breath, life threatening emergency, suicidal or homicidal thoughts  you must seek medical attention immediately by calling 911 or calling your MD immediately  if symptoms less severe.  You Must read complete instructions/literature along with all the possible adverse reactions/side effects for all the Medicines you take and that have been prescribed to you. Take any new Medicines after you have completely understood and accpet all the possible adverse reactions/side effects.   Do not drive, operate heavy machinery, perform activities at heights, swimming or participation in water activities or provide baby sitting services if your were admitted for syncope or siezures until you have seen by Primary MD or a Neurologist and advised to do so again.  Do not drive when taking Pain medications.   Discharge patient   Complete by: As directed    Discharge disposition: 03-Skilled Nursing Facility   Discharge patient date: 02/21/2019   Increase activity slowly   Complete by: As directed    Weight bearing as tolerated   Complete by: As directed      Allergies as of 02/19/2019   No Known Allergies     Medication List    STOP taking these medications   LORazepam 0.5 MG tablet Commonly known as: ATIVAN Replaced by: ALPRAZolam 0.5 MG tablet   TYLENOL 500 MG tablet  Generic drug: acetaminophen     TAKE these medications   ALPRAZolam 0.5 MG tablet Commonly known as: XANAX Take 1 tablet (0.5 mg total) by mouth 2 (two) times daily as needed for up to 5 days for anxiety. Replaces: LORazepam 0.5 MG tablet   aspirin EC 81 MG tablet Take 1 tablet (81 mg total) by mouth daily. Notes to patient: After 2 weeks of Lovenox   citalopram 10 MG tablet Commonly known as: CELEXA Take 20 mg by mouth daily.   enoxaparin 40 MG/0.4ML injection Commonly known as: LOVENOX Inject 0.4 mLs (40 mg total) into the skin daily.   HYDROcodone-acetaminophen 7.5-325 MG tablet Commonly known as: Norco Take 1-2 tablets by mouth 3 (three) times daily as needed for moderate pain.    methimazole 10 MG tablet Commonly known as: TAPAZOLE Take 1.5 tablets (15 mg total) by mouth daily.   metoprolol succinate 25 MG 24 hr tablet Commonly known as: TOPROL-XL Take 25 mg by mouth daily.   polyethylene glycol 17 g packet Commonly known as: MIRALAX / GLYCOLAX Take 17 g by mouth daily as needed for moderate constipation or severe constipation.   senna-docusate 8.6-50 MG tablet Commonly known as: Senokot-S Take 2 tablets by mouth at bedtime as needed for mild constipation or moderate constipation.            Discharge Care Instructions  (From admission, onward)         Start     Ordered   02/13/19 0000  Weight bearing as tolerated     02/13/19 1612          Contact information for follow-up providers    Leandrew Koyanagi, MD In 2 weeks.   Specialty: Orthopedic Surgery Why: For suture removal, For wound re-check Contact information: [place] [address] [phone]        Ronita Hipps, MD. Schedule an appointment as soon as possible for a visit in 2 week(s).   Specialty: Family Medicine Contact information: [place] [address] [phone]            Contact information for after-discharge care    Destination    [facility]erred [facility] .   Service: Skilled Nursing Contact information: [address] [place] [phone]                 No Known Allergies  You were cared for by a hospitalist during your hospital stay. If you have any questions about your discharge medications or the care you received while you were in the hospital after you are discharged, you can call the unit and asked to speak with the hospitalist on call if the hospitalist that took care of you is not available. Once you are discharged, your primary care physician will handle any further medical issues. Please note that no refills for any discharge medications will be  authorized once you are discharged, as it is imperative that you return to your primary care physician (or establish a relationship with a primary care physician if you do not have one) for your aftercare needs so that they can reassess your need for medications and monitor your lab values.   Procedures/Studies: Dg Chest 1 View  Result Date: 02/12/2019 CLINICAL DATA:  Fall EXAM: CHEST  1 VIEW COMPARISON:  Radiograph 02/04/2018, CT chest 01/11/2016 FINDINGS: The heart size and mediastinal contours are within normal limits. Both lungs are clear. The visualized soft tissues and osseous  structures are unremarkable. IMPRESSION: No active disease. Electronically Signed   By: MD Kreg Shropshire   On: 02/12/2019 17:57   Pelvis Portable  Result Date: 02/13/2019 CLINICAL DATA:  Status post ORIF of proximal left femoral fracture EXAM: PORTABLE PELVIS 1-2 VIEWS COMPARISON:  Intraoperative films from earlier in the same day. FINDINGS: Medullary rod is noted within the proximal left femur. Two fixation screws are noted traversing the femoral neck. Fracture fragments are in near anatomic alignment. IMPRESSION: ORIF of proximal left femoral fracture. Electronically Signed   By: Alcide Clever M.D.   On: 02/13/2019 17:28   Dg C-arm 1-60 Min  Result Date: 02/13/2019 CLINICAL DATA:  ORIF. EXAM: LEFT FEMUR 2 VIEWS; DG C-ARM 61-120 MIN COMPARISON:  02/12/2019. FINDINGS: ORIF left femur. Hardware intact. Anatomic alignment. 2 minutes 27 seconds fluoroscopy time utilized. IMPRESSION: ORIF left femur.  Anatomic alignment. Electronically Signed   By: Maisie Fus  Register   On: 02/13/2019 16:29   US Thyroid  Result Date: 02/06/2019 CLINICAL DATA:  Hyperthyroidism EXAM: THYROID ULTRASOUND TECHNIQUE: Ultrasound examination of the thyroid gland and adjacent soft tissues was performed. COMPARISON:  None. FINDINGS: Parenchymal Echotexture: Mildly heterogenous Isthmus: 5 mm Right lobe: 5.4 x 2.0 x 2.2 cm Left lobe: 5.3 x 2.2 x 1.6 cm  _________________________________________________________ Estimated total number of nodules >/= 1 cm: 1 Number of spongiform nodules >/=  2 cm not described below (TR1): 0 Number of mixed cystic and solid nodules >/= 1.5 cm not described below (TR2): 0 _________________________________________________________ Nodule # 1: Location: Right; Mid Maximum size: 1.5 cm; Other 2 dimensions: 1.3 x 1.1 cm Composition: solid/almost completely solid (2) Echogenicity: isoechoic (1) Shape: not taller-than-wide (0) Margins: ill-defined (0) Echogenic foci: macrocalcifications (1) ACR TI-RADS total points: 4. ACR TI-RADS risk category: TR4 (4-6 points). ACR TI-RADS recommendations: **Given size (>/= 1.5 cm) and appearance, fine needle aspiration of this moderately suspicious nodule should be considered based on TI-RADS criteria. _________________________________________________________ There is an additional right thyroid inferior pole 7 mm hypoechoic nodule. This would not meet criteria for any biopsy or follow-up. Normal thyroid vascularity No significant left thyroid abnormality.  No regional adenopathy. IMPRESSION: 1.5 cm right mid thyroid TR 4 nodule meets criteria for biopsy as above. Nonspecific mild thyroid heterogeneity.  Normal vascularity. The above is in keeping with the ACR TI-RADS recommendations - J Am Coll Radiol 2017;14:587-595. Electronically Signed   By: Judie Petit.  Shick M.D.   On: 02/06/2019 14:19   Dg Hip Unilat With Pelvis 2-3 Views Left  Result Date: 02/12/2019 CLINICAL DATA:  Fall, left hip pain, shortening of the left leg EXAM: DG HIP (WITH OR WITHOUT PELVIS) 2-3V LEFT COMPARISON:  None. FINDINGS: Highly comminuted intertrochanteric left femur fracture with marked varus angulation and foreshortening across the fracture line medial displacement of the distal fracture fragment by approximately 1 shaft width. Remaining bones of the pelvis are congruent. Femoral heads remain normally located. Degenerative changes  present in lumbar spine. There is significant soft tissue swelling of the left hip with effusion. Remaining soft tissues are unremarkable. IMPRESSION: Comminuted, displaced and foreshortened, varus angulated intertrochanteric left femur fracture. Electronically Signed   By: MD Kreg Shropshire   On: 02/12/2019 17:55   Dg Femur Min 2 Views Left  Result Date: 02/13/2019 CLINICAL DATA:  ORIF. EXAM: LEFT FEMUR 2 VIEWS; DG C-ARM 61-120 MIN COMPARISON:  02/12/2019. FINDINGS: ORIF left femur. Hardware intact. Anatomic alignment. 2 minutes 27 seconds fluoroscopy time utilized. IMPRESSION: ORIF left femur.  Anatomic alignment. Electronically Signed  ByMaisie Fus: Thomas  Register   On: 02/13/2019 16:29     The results of significant diagnostics from this hospitalization (including imaging, microbiology, ancillary and laboratory) are listed below for reference.     Microbiology: Recent Results (from the past 240 hour(s))  SARS Coronavirus 2 (CEPHEID - Performed in [facility] hospital lab), Hosp Order     Status: None   Collection Time: 02/12/19  5:20 PM   Specimen: Nasopharyngeal Swab  Result Value Ref Range Status   SARS Coronavirus 2 NEGATIVE NEGATIVE Final    Comment: (NOTE) If result is NEGATIVE SARS-CoV-2 target nucleic acids are NOT DETECTED. The SARS-CoV-2 RNA is generally detectable in upper and lower  respiratory specimens during the acute phase of infection. The lowest  concentration of SARS-CoV-2 viral copies this assay can detect is 250  copies / mL. A negative result does not preclude SARS-CoV-2 infection  and should not be used as the sole basis for treatment or other  patient management decisions.  A negative result may occur with  improper specimen collection / handling, submission of specimen other  than nasopharyngeal swab, presence of viral mutation(s) within the  areas targeted by this assay, and inadequate number of viral copies  (<250 copies / mL). A negative result must be combined with  clinical  observations, patient history, and epidemiological information. If result is POSITIVE SARS-CoV-2 target nucleic acids are DETECTED. The SARS-CoV-2 RNA is generally detectable in upper and lower  respiratory specimens dur ing the acute phase of infection.  Positive  results are indicative of active infection with SARS-CoV-2.  Clinical  correlation with patient history and other diagnostic information is  necessary to determine patient infection status.  Positive results do  not rule out bacterial infection or co-infection with other viruses. If result is PRESUMPTIVE POSTIVE SARS-CoV-2 nucleic acids MAY BE PRESENT.   A presumptive positive result was obtained on the submitted specimen  and confirmed on repeat testing.  While 2019 novel coronavirus  (SARS-CoV-2) nucleic acids may be present in the submitted sample  additional confirmatory testing may be necessary for epidemiological  and / or clinical management purposes  to differentiate between  SARS-CoV-2 and other Sarbecovirus currently known to infect humans.  If clinically indicated additional testing with an alternate test  methodology [phone]([id number]) is advised. The SARS-CoV-2 RNA is generally  detectable in upper and lower respiratory sp ecimens during the acute  phase of infection. The expected result is Negative. Fact Sheet for Patients:  BoilerBrush.com.cyhttps://www.fda.gov/media/136312/download Fact Sheet for Healthcare Providers: https://pope.com/https://www.fda.gov/media/136313/download This test is not yet approved or cleared by the [place] FDA and has been authorized for detection and/or diagnosis of SARS-CoV-2 by FDA under an Emergency Use Authorization (EUA).  This EUA will remain in effect (meaning this test can be used) for the duration of the COVID-19 declaration under Section 564(b)(1) of the Act, 21 U.S.C. section 360bbb-3(b)(1), unless the authorization is terminated or revoked sooner. Performed at [facility],  [address] [place], [place] [address]   Surgical pcr screen     Status: Abnormal   Collection Time: 02/12/19 11:15 PM   Specimen: Nasal Mucosa; Nasal Swab  Result Value Ref Range Status   MRSA, PCR POSITIVE (A) NEGATIVE Final   Staphylococcus aureus POSITIVE (A) NEGATIVE Final    Comment: RESULT CALLED TO, READ BACK BY AND VERIFIED WITHCorky Downs: S WHITEHORN RN 02/13/19 0055 JDW (NOTE) The Xpert SA Assay (FDA approved for NASAL specimens in patients 322 years of age and older),  is one component of a comprehensive surveillance program. It is not intended to diagnose infection nor to guide or monitor treatment. Performed at [facility], [address] [place], [place] [address]      Labs: BNP (last 3 results) No results for input(s): BNP in the last 8760 hours. Basic Metabolic Panel: Recent Labs  Lab 02/14/19 0242 02/15/19 0248 02/16/19 0423 02/17/19 0421 02/18/19 0256  NA 136 134* 134* 135 133*  K 4.3 3.9 4.3 4.1 4.1  CL 105 103 104 104 100  CO2 21* 21* GLUCOSE 145* 83 90 90 103*  BUN CREATININE 0.78 0.69 0.65 0.67 0.68  CALCIUM 8.4* 7.9* 8.0* 7.8* 8.0*  MG 1.9 1.9 1.9 1.8 1.9   Liver Function Tests: Recent Labs  Lab 02/12/19 2206  ALBUMIN 3.4*   No results for input(s): LIPASE, AMYLASE in the last 168 hours. No results for input(s): AMMONIA in the last 168 hours. CBC: Recent Labs  Lab 02/12/19 1720  02/14/19 0242 02/15/19 0248 02/16/19 0423 02/17/19 0421 02/18/19 0256  WBC 10.0   < > 16.5* 8.2 7.1 5.1 7.1  NEUTROABS 7.6  --   --   --   --   --   --   HGB 15.0   < > 12.6* 10.5* 10.1* 9.9* 10.6*  HCT 44.9   < > 37.1* 32.0* 30.7* 29.9* 31.3*  MCV 88.7   < > 86.9 89.4 89.0 89.0 87.2  PLT 269   < > 296 255 272 280 300   < > = values in this interval not displayed.   Cardiac Enzymes: No results for input(s): CKTOTAL, CKMB, CKMBINDEX, TROPONINI in the last 168 hours. BNP: Invalid input(s): POCBNP CBG: No results for  input(s): GLUCAP in the last 168 hours. D-Dimer No results for input(s): DDIMER in the last 72 hours. Hgb A1c No results for input(s): HGBA1C in the last 72 hours. Lipid Profile No results for input(s): CHOL, HDL, LDLCALC, TRIG, CHOLHDL, LDLDIRECT in the last 72 hours. Thyroid function studies No results for input(s): TSH, T4TOTAL, T3FREE, THYROIDAB in the last 72 hours.  Invalid input(s): FREET3 Anemia work up No results for input(s): VITAMINB12, FOLATE, FERRITIN, TIBC, IRON, RETICCTPCT in the last 72 hours. Urinalysis    Component Value Date/Time   COLORURINE yellow 04/21/2009 0928   APPEARANCEUR Clear 04/21/2009 0928   LABSPEC 1.020 04/21/2009 0928   PHURINE 7.0 04/21/2009 0928   HGBUR negative 04/21/2009 0928   BILIRUBINUR n 11/10/2013 1044   PROTEINUR n 11/10/2013 1044   UROBILINOGEN 0.2 11/10/2013 1044   UROBILINOGEN 0.2 04/21/2009 0928   NITRITE n 11/10/2013 1044   NITRITE negative 04/21/2009 0928   LEUKOCYTESUR Negative 11/10/2013 1044   Sepsis Labs Invalid input(s): PROCALCITONIN,  WBC,  LACTICIDVEN Microbiology Recent Results (from the past 240 hour(s))  SARS Coronavirus 2 (CEPHEID - Performed in [facility] hospital lab), Hosp Order     Status: None   Collection Time: 02/12/19  5:20 PM   Specimen: Nasopharyngeal Swab  Result Value Ref Range Status   SARS Coronavirus 2 NEGATIVE NEGATIVE Final    Comment: (NOTE) If result is NEGATIVE SARS-CoV-2 target nucleic acids are NOT DETECTED. The SARS-CoV-2 RNA is generally detectable in upper and lower  respiratory specimens during the acute phase of infection. The lowest  concentration of SARS-CoV-2 viral copies this assay can detect is 250  copies / mL. A negative result does not preclude SARS-CoV-2 infection  and  should not be used as the sole basis for treatment or other  patient management decisions.  A negative result may occur with  improper specimen collection / handling, submission of specimen other  than  nasopharyngeal swab, presence of viral mutation(s) within the  areas targeted by this assay, and inadequate number of viral copies  (<250 copies / mL). A negative result must be combined with clinical  observations, patient history, and epidemiological information. If result is POSITIVE SARS-CoV-2 target nucleic acids are DETECTED. The SARS-CoV-2 RNA is generally detectable in upper and lower  respiratory specimens dur ing the acute phase of infection.  Positive  results are indicative of active infection with SARS-CoV-2.  Clinical  correlation with patient history and other diagnostic information is  necessary to determine patient infection status.  Positive results do  not rule out bacterial infection or co-infection with other viruses. If result is PRESUMPTIVE POSTIVE SARS-CoV-2 nucleic acids MAY BE PRESENT.   A presumptive positive result was obtained on the submitted specimen  and confirmed on repeat testing.  While 2019 novel coronavirus  (SARS-CoV-2) nucleic acids may be present in the submitted sample  additional confirmatory testing may be necessary for epidemiological  and / or clinical management purposes  to differentiate between  SARS-CoV-2 and other Sarbecovirus currently known to infect humans.  If clinically indicated additional testing with an alternate test  methodology [phone]([id number]) is advised. The SARS-CoV-2 RNA is generally  detectable in upper and lower respiratory sp ecimens during the acute  phase of infection. The expected result is Negative. Fact Sheet for Patients:  BoilerBrush.com.cyhttps://www.fda.gov/media/136312/download Fact Sheet for Healthcare Providers: https://pope.com/https://www.fda.gov/media/136313/download This test is not yet approved or cleared by the [place] FDA and has been authorized for detection and/or diagnosis of SARS-CoV-2 by FDA under an Emergency Use Authorization (EUA).  This EUA will remain in effect (meaning this test can be used) for the duration of  the COVID-19 declaration under Section 564(b)(1) of the Act, 21 U.S.C. section 360bbb-3(b)(1), unless the authorization is terminated or revoked sooner. Performed at [facility], [address] [place], [place] [address]   Surgical pcr screen     Status: Abnormal   Collection Time: 02/12/19 11:15 PM   Specimen: Nasal Mucosa; Nasal Swab  Result Value Ref Range Status   MRSA, PCR POSITIVE (A) NEGATIVE Final   Staphylococcus aureus POSITIVE (A) NEGATIVE Final    Comment: RESULT CALLED TO, READ BACK BY AND VERIFIED WITH: Corky DownsS WHITEHORN RN 02/13/19 0055 JDW (NOTE) The Xpert SA Assay (FDA approved for NASAL specimens in patients 82 years of age and older), is one component of a comprehensive surveillance program. It is not intended to diagnose infection nor to guide or monitor treatment. Performed at [facility], [address] [place], [place] [address]      Time coordinating discharge:  I have spent 25 minutes face to face with the patient and on the ward discussing the patients care, assessment, plan and disposition with other care givers. >50% of the time was devoted counseling the patient about the risks and benefits of treatment/Discharge disposition and coordinating care.   SIGNED:   Rhetta MuraJai-Gurmukh Devyn Griffing, MD  [facility] 02/19/2019, 12:22 PM   If 7PM-7AM, please contact night-coverage [email or web address]

## 2019-02-19 NOTE — Progress Notes (Signed)
CSW followed up with [facility] they report insurance auth still being under review and that they have had many referrals today.   [facility] updated.   [place], [place]

## 2019-02-20 LAB — CREATININE, SERUM
Creatinine, Ser: 0.71 mg/dL (ref 0.61–1.24)
GFR calc Af Amer: >60 mL/min (ref >60)
GFR calc non Af Amer: >60 mL/min (ref >60)

## 2019-02-20 MED ORDER — ENSURE ENLIVE PO LIQD
237.0000 mL | Freq: Two times a day (BID) | ORAL | Status: DC
  Administered 2019-02-20 – 2019-02-21 (×2): 237 mL via ORAL

## 2019-02-20 NOTE — Progress Notes (Signed)
Patient seen and examined and in no distress He is looking a little bit more lively than he did yesterday He is about to have lunch and looks like he worked well with therapy services today I did a peer to peer call with Dr. Amalia Hailey however this was proven unnecessary as he is already approved the patient's discharge to skilled facility given patient's progress with therapy services Clinical social work will update family members later on today and he should be able to discharge tomorrow-there is been no overt change over the past 48 hours in terms of his medical condition  On exam BP 130/85   Pulse 76   Temp 98.5 F (36.9 C) (Oral)   Resp 14   SpO2 100%  Extremely hard of hearing smiling Arcus senilis By temporalis wasting supraclavicular wasting Throat soft supple no thyromegaly Chest clinically clear no added sound Abdomen soft nontender no rebound Neurologically intact however did not test to be on motor Patient does have bandage over repaired hip he has reasonable range of motion however this is limited by pain   Plan-discharge to skilled facility tomorrow  This is a no charge note  Verneita Griffes, MD [facility] 4:08 PM

## 2019-02-20 NOTE — Progress Notes (Signed)
Occupational Therapy Treatment Patient Details Name: Bryan Paul MRN: [id number] DOB: 03/26/1935 Today's Date: 02/20/2019    History of present illness 84 yo male with onset of fall and resulting L closed comminuted intertrochanteric fracture was referred to rehab after IM nailing, WBAT.  Pt was home alone per chart, had unwitnessed fall.  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  PMHx:  dilated cardiomyopathy, a-flutter, CHF, malnutrition, HTN, conductive hearing loss,    OT comments  Pt tolerated session well with no complaints of pain. Pt pleasant and agreeable to session. Pt demonstrates limited function in [facility] transfer due to limited standing tolerance and decreased UE strength for sit to stand transition. Pt required Max A +2, Max A toilet hygiene. Mod A LB dressing, ability to doff sock, requires assistance to don. Pt engaged in UE exercise with use of therabands while seated at EOB. Pt required Min A in supported sitting due to posterior lean. Pt will benefit from continued skilled therapy in SNF level to increase strength for ADLs and functional mobility prior to returning to home setting.    Follow Up Recommendations  SNF;Supervision/Assistance - 24 hour    Equipment Recommendations  3 in 1 bedside commode    Recommendations for Other Services Speech consult    Precautions / Restrictions Precautions Precautions: Fall Precaution Comments: Deaf use white board to communicate, read lips well Restrictions Weight Bearing Restrictions: No LLE Weight Bearing: Weight bearing as tolerated       Mobility Bed Mobility Overal bed mobility: Needs Assistance Bed Mobility: Supine to Sit;Rolling     Supine to sit: Mod assist Sit to supine: Mod assist;+2 for physical assistance   General bed mobility comments: Pt required assistance to elevate trunk into sitting and move legs to edge of bed. Supported sitting required due to posterior lean at EOB.   Transfers Overall  transfer level: Needs assistance Equipment used: Rolling walker (2 wheeled) Transfers: Stand Pivot Transfers Sit to Stand: Max assist;+2 physical assistance Stand pivot transfers: Max assist;+2 physical assistance       General transfer comment: Increased assistance to perform transfers today.  he appears more fatigue after multiple bouts of BMs during mobility including need for sitting on commode x2.    Balance Overall balance assessment: History of Falls;Needs assistance Sitting-balance support: Feet supported;Bilateral upper extremity supported Sitting balance-Leahy Scale: Poor Sitting balance - Comments: tends to post. bedside and requires cues to sit upright Postural control: Other (comment)(forward lean) Standing balance support: Bilateral upper extremity supported;During functional activity Standing balance-Leahy Scale: Poor Standing balance comment: External assistance to maintain standing.  BUE support from RW                           ADL either performed or assessed with clinical judgement   ADL Overall ADL's : Needs assistance/impaired                     Lower Body Dressing: Moderate assistance   Toilet Transfer: Maximal assistance;+2 for safety/equipment;+2 for physical assistance;Stand-pivot Toilet Transfer Details (indicate cue type and reason): toilet transfer from bed to bsc with Max A +2, pivot to chair.  Toileting- Clothing Manipulation and Hygiene: Maximal assistance Toileting - Clothing Manipulation Details (indicate cue type and reason): required Max A for safety in standing due to inability to lean in seated position.      Functional mobility during ADLs: Maximal assistance;+2 for physical assistance;+2 for safety/equipment General ADL Comments: Pt  limited in functional mobility, however increased motivation to perform toilet transfer. Written communitcation and hand gestures provided.      Vision       Perception     Praxis       Cognition Arousal/Alertness: Awake/alert Behavior During Therapy: Flat affect;WFL for tasks assessed/performed Overall Cognitive Status: Within Functional Limits for tasks assessed                                 General Comments: Continues to require increased time to understand commands due to hearing impairment.        Exercises Exercises: General Upper Extremity General Exercises - Upper Extremity Elbow Flexion: Strengthening;Both;10 reps;Seated;Theraband Theraband Level (Elbow Flexion): Level 2 (Red) Elbow Extension: Strengthening;10 reps;Seated;Theraband Theraband Level (Elbow Extension): Level 2 (Red)   Shoulder Instructions       General Comments UE exercise provided to improve strength for transfers.     Pertinent Vitals/ Pain       Pain Assessment: No/denies pain Pain Location: L hip Pain Descriptors / Indicators: Grimacing;Discomfort;Aching Pain Intervention(s): Monitored during session  Home Living                                          Prior Functioning/Environment              Frequency  Min 2X/week        Progress Toward Goals  OT Goals(current goals can now be found in the care plan section)  Progress towards OT goals: Not progressing toward goals - comment(Still limited in functional transfers)  Acute Rehab OT Goals Patient Stated Goal: none stated OT Goal Formulation: Patient unable to participate in goal setting Time For Goal Achievement: 02/28/19 Potential to Achieve Goals: Fair ADL Goals Pt Will Perform Grooming: with min guard assist;sitting Pt Will Perform Lower Body Dressing: with mod assist;sitting/lateral leans Pt Will Transfer to Toilet: with mod assist;stand pivot transfer;bedside commode  Plan Discharge plan remains appropriate;Frequency remains appropriate    Co-evaluation                 AM-PAC OT "6 Clicks" Daily Activity     Outcome Measure   Help from another person eating  meals?: A Little Help from another person taking care of personal grooming?: A Little Help from another person toileting, which includes using toliet, bedpan, or urinal?: A Lot Help from another person bathing (including washing, rinsing, drying)?: A Lot Help from another person to put on and taking off regular upper body clothing?: A Little Help from another person to put on and taking off regular lower body clothing?: A Lot 6 Click Score: 15    End of Session Equipment Utilized During Treatment: Gait belt;Rolling walker  OT Visit Diagnosis: Unsteadiness on feet (R26.81);Repeated falls (R29.6);Muscle weakness (generalized) (M62.81);Pain Pain - Right/Left: Left Pain - part of body: Hip   Activity Tolerance Patient tolerated treatment well   Patient Left in bed;with call bell/phone within reach;with bed alarm set;with nursing/sitter in room   Nurse Communication Mobility status;Weight bearing status        Time: [id number] OT Time Calculation (min): 35 min  Charges: OT General Charges $OT Visit: 1 Visit OT Treatments $Self Care/Home Management : 23-37 mins  [place]quette OldEvan Haden Suder, MSOT, OTR/L  Supplemental Rehabilitation Services  [phone]    Zigmund Danielvan M Ashlon Lottman 02/20/2019,  10:54 AM

## 2019-02-20 NOTE — Progress Notes (Signed)
Nutrition Follow-up   RD working remotely.  DOCUMENTATION CODES:   Severe malnutrition in context of chronic illness  INTERVENTION:  Provide Ensure Enlive po BID, each supplement provides 350 kcal and 20 grams of protein  Encourage adequate PO intake.   NUTRITION DIAGNOSIS:   Severe Malnutrition related to poor appetite, chronic illness as evidenced by percent weight loss, energy intake < or equal to 75% for > or equal to 1 month(per family report); ongoing  GOAL:   Patient will meet greater than or equal to 90% of their needs; progressing  MONITOR:   Weight trends, Diet advancement  REASON FOR ASSESSMENT:   Consult Assessment of nutrition requirement/status  ASSESSMENT:   84 year old male with past medical history significant of  HTN, CHF, atrial flutter, cardiomyopathy,mild dementia, and significant bilateral hearing loss who presents with left intertrochanteric fracture s/p mechanical fall at home.  PROCEDURE (7/9): Treatment of subtrochanteric fracture with intramedullary implant  Pt pending SNF placement. Meal completion has been varied from 0-75%. RD to order nutritional supplements to aid in caloric and protein needs as well as in post op healing. Pt encouraged to eat his food at meals and to drink his supplements.   Labs and medications reviewed.   Diet Order:   Diet Order            Diet - low sodium heart healthy        Diet regular Room service appropriate? No; Fluid consistency: Thin  Diet effective now              EDUCATION NEEDS:   Not appropriate for education at this time  Skin:  Skin Assessment: Reviewed RN Assessment  Last BM:  7/14  Height:   Ht Readings from Last 1 Encounters:  02/04/19 5\' 5"  (1.651 m)    Weight:   Wt Readings from Last 1 Encounters:  02/04/19 66.7 kg    Ideal Body Weight:  61.8 kg  BMI:  There is no height or weight on file to calculate BMI.  Estimated Nutritional Needs:   Kcal:  1600-1800  Protein:   80-90 grams  Fluid:  1.6 - 1.8 L/day    Corrin Parker, MS, RD, LDN Pager # [phone] After hours/ weekend pager # [phone]

## 2019-02-20 NOTE — Progress Notes (Signed)
CSW received notification from [facility] patient has insurance auth due to updated PT/OT notes describing patient's need for SNF.   CSW informed [facility], they report it is too late today for patient to transfer, plan for discharge tomorrow morning 7/17. MD informed.   Bryan Paul, [place]

## 2019-02-20 NOTE — Progress Notes (Signed)
Physical Therapy Treatment Patient Details Name: Bryan Paul MRN: [id number] DOB: 1934-10-10 Today's Date: 02/20/2019    History of Present Illness 84 yo male with onset of fall and resulting L closed comminuted intertrochanteric fracture was referred to rehab after IM nailing, WBAT.  Pt was home alone per chart, had unwitnessed fall.  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  PMHx:  dilated cardiomyopathy, a-flutter, CHF, malnutrition, HTN, conductive hearing loss,     PT Comments    Pt performed multiple transfers with both RW and sara + sit to stand lift.  Pt B UEs fatiguing quickly and noted increased pain in L hip today.  Pt only able to tolerate standing x 15 sec at a time.  Based on his plateau with his progress he continues to benefit from SNF to allow for a slower pace at rehab.  Plan remains appropriate for snf.     Follow Up Recommendations  SNF     Equipment Recommendations  None recommended by PT    Recommendations for Other Services       Precautions / Restrictions Precautions Precautions: Fall Precaution Comments: Deaf use white board to communicate, read lips well Restrictions Weight Bearing Restrictions: No LLE Weight Bearing: Weight bearing as tolerated    Mobility  Bed Mobility Overal bed mobility: Needs Assistance Bed Mobility: Supine to Sit     Supine to sit: Mod assist     General bed mobility comments: Assistance to elevate LEs to edge of bed.  Pt reached for PTA as railing to achieve sitting edge of bed.  Pt able to hold balance unassisted edge of bed. and scoot forward.  Transfers Overall transfer level: Needs assistance Equipment used: Rolling walker (2 wheeled) Transfers: Sit to/from [employer] Sit to Stand: Max assist;+2 physical assistance Stand pivot transfers: Max assist;+2 physical assistance       General transfer comment: Pt continues to require max +2 to achieve standing.  he presents with flexed hips and  posterior lean in standing.  Pt lacks ability to take steps today due to pain.  Pt performed sit to stand with RW x1 and pivot with RW.  Additional sit to stand x3 with B platform sara +.  Pt able to come more upright in lift but lacks ability to stand any longer than 15 sec at a time.  Ambulation/Gait Ambulation/Gait assistance: (NT unable to progress step today due to pain.)               Stairs             Wheelchair Mobility    Modified Rankin (Stroke Patients Only)       Balance     Sitting balance-Leahy Scale: Fair       Standing balance-Leahy Scale: Poor Standing balance comment: External assistance to maintain standing.  BUE support from RW                            Cognition Arousal/Alertness: Awake/alert Behavior During Therapy: Flat affect;WFL for tasks assessed/performed Overall Cognitive Status: Within Functional Limits for tasks assessed                                 General Comments: Continues to require increased time to understand commands due to hearing impairment.      Exercises      General Comments  Pertinent Vitals/Pain Pain Assessment: Faces Faces Pain Scale: Hurts even more Pain Location: L hip Pain Descriptors / Indicators: Grimacing;Discomfort;Aching Pain Intervention(s): RN gave pain meds during session;Monitored during session    Home Living                      Prior Function            PT Goals (current goals can now be found in the care plan section) Acute Rehab PT Goals Patient Stated Goal: none stated PT Goal Formulation: Patient unable to participate in goal setting Potential to Achieve Goals: Good Progress towards PT goals: Not progressing toward goals - comment(due to pain and anxiety today.)    Frequency    Min 3X/week      PT Plan Current plan remains appropriate    Co-evaluation              AM-PAC PT "6 Clicks" Mobility   Outcome Measure   Help needed turning from your back to your side while in a flat bed without using bedrails?: A Lot Help needed moving from lying on your back to sitting on the side of a flat bed without using bedrails?: A Lot Help needed moving to and from a bed to a chair (including a wheelchair)?: Total Help needed standing up from a chair using your arms (e.g., wheelchair or bedside chair)?: Total Help needed to walk in hospital room?: Total Help needed climbing 3-5 steps with a railing? : Total 6 Click Score: 8    End of Session Equipment Utilized During Treatment: Gait belt Activity Tolerance: Patient limited by fatigue;Treatment limited secondary to medical complications (Comment) Patient left: in chair;with call bell/phone within reach;with chair alarm set Nurse Communication: Mobility status PT Visit Diagnosis: Unsteadiness on feet (R26.81);Muscle weakness (generalized) (M62.81);Difficulty in walking, not elsewhere classified (R26.2);Pain Pain - Right/Left: Left Pain - part of body: Hip     Time: 5409-8119 PT Time Calculation (min) (ACUTE ONLY): 37 min  Charges:  $Therapeutic Activity: 23-37 mins                     [name], PTA Acute Rehabilitation Services Pager [phone] Office [phone]     Tiwanda Threats [name] 02/20/2019, 2:25 PM

## 2019-02-20 NOTE — Care Management Important Message (Signed)
Important Message  Patient Details  Name: Bryan Paul MRN: [id number] Date of Birth: 06-Jan-1935   Medicare Important Message Given:  Yes     Memory Argue 02/20/2019, 3:35 PM

## 2019-02-20 NOTE — Progress Notes (Signed)
CSW informed by [employer] they are denying insurance auth for patient to go to SNF at this time. They report they are basing this off of OT note yesterday in which patient was asleep, and PT note from 2 days ago in which patient was incontinent. They are questioning his ability to work with therapy.   CSW has informed family who is in agreement with peer to peer review being done.   CSW has informed Dr. Verlon Au of this information. MD requesting updated PT and OT notes before peer to peer call.   CSW has reached out to both PT and OT to request patient be seen today for updated notes.   [place], [place]

## 2019-02-21 DIAGNOSIS — R531 Weakness: Secondary | ICD-10-CM | POA: Diagnosis not present

## 2019-02-21 DIAGNOSIS — Z7401 Bed confinement status: Secondary | ICD-10-CM | POA: Diagnosis not present

## 2019-02-21 DIAGNOSIS — N401 Enlarged prostate with lower urinary tract symptoms: Secondary | ICD-10-CM | POA: Diagnosis not present

## 2019-02-21 DIAGNOSIS — S72142A Displaced intertrochanteric fracture of left femur, initial encounter for closed fracture: Secondary | ICD-10-CM | POA: Diagnosis not present

## 2019-02-21 DIAGNOSIS — R1312 Dysphagia, oropharyngeal phase: Secondary | ICD-10-CM | POA: Diagnosis not present

## 2019-02-21 DIAGNOSIS — I4891 Unspecified atrial fibrillation: Secondary | ICD-10-CM | POA: Diagnosis not present

## 2019-02-21 DIAGNOSIS — I10 Essential (primary) hypertension: Secondary | ICD-10-CM | POA: Diagnosis not present

## 2019-02-21 DIAGNOSIS — M2681 Anterior soft tissue impingement: Secondary | ICD-10-CM | POA: Diagnosis not present

## 2019-02-21 DIAGNOSIS — Z741 Need for assistance with personal care: Secondary | ICD-10-CM | POA: Diagnosis not present

## 2019-02-21 DIAGNOSIS — M2550 Pain in unspecified joint: Secondary | ICD-10-CM | POA: Diagnosis not present

## 2019-02-21 DIAGNOSIS — M25552 Pain in left hip: Secondary | ICD-10-CM | POA: Diagnosis not present

## 2019-02-21 DIAGNOSIS — M6281 Muscle weakness (generalized): Secondary | ICD-10-CM | POA: Diagnosis not present

## 2019-02-21 DIAGNOSIS — Z9181 History of falling: Secondary | ICD-10-CM | POA: Diagnosis not present

## 2019-02-21 DIAGNOSIS — R2681 Unsteadiness on feet: Secondary | ICD-10-CM | POA: Diagnosis not present

## 2019-02-21 DIAGNOSIS — S72142E Displaced intertrochanteric fracture of left femur, subsequent encounter for open fracture type I or II with routine healing: Secondary | ICD-10-CM | POA: Diagnosis not present

## 2019-02-21 DIAGNOSIS — E0590 Thyrotoxicosis, unspecified without thyrotoxic crisis or storm: Secondary | ICD-10-CM | POA: Diagnosis not present

## 2019-02-21 DIAGNOSIS — H900 Conductive hearing loss, bilateral: Secondary | ICD-10-CM | POA: Diagnosis not present

## 2019-02-21 DIAGNOSIS — I509 Heart failure, unspecified: Secondary | ICD-10-CM | POA: Diagnosis not present

## 2019-02-21 DIAGNOSIS — W19XXXA Unspecified fall, initial encounter: Secondary | ICD-10-CM | POA: Diagnosis not present

## 2019-02-21 DIAGNOSIS — N400 Enlarged prostate without lower urinary tract symptoms: Secondary | ICD-10-CM | POA: Diagnosis not present

## 2019-02-21 DIAGNOSIS — I4892 Unspecified atrial flutter: Secondary | ICD-10-CM | POA: Diagnosis not present

## 2019-02-21 DIAGNOSIS — I959 Hypotension, unspecified: Secondary | ICD-10-CM | POA: Diagnosis not present

## 2019-02-21 NOTE — Plan of Care (Signed)
  Problem: Education: Goal: Verbalization of understanding the information provided (i.e., activity precautions, restrictions, etc) will improve Outcome: Progressing   Problem: Pain Management: Goal: Pain level will decrease Outcome: Progressing   

## 2019-02-21 NOTE — TOC Transition Note (Signed)
Transition of Care [facility]) - CM/SW Discharge Note   Patient Details  Name: Bryan Paul MRN: [id number] Date of Birth: [birth year]  Transition of Care [facility]) CM/SW Contact:  Gelene Mink, [place] Phone Number: 02/21/2019, 10:08 AM   Clinical Narrative:     Patient will DC to: [place]  Anticipated DC date: 02/21/2019 Family notified: Yes Transport by: Corey Harold   Per MD patient ready for DC to . RN, patient, patient's family, and facility notified of DC. Discharge Summary and FL2 sent to facility. RN to call report prior to discharge [phone]).  The patient will report to room 108. DC packet on chart. Ambulance transport requested for patient.   CSW will sign off for now as social work intervention is no longer needed. Please consult [place] again if new needs arise.  Yomira Flitton, LCSW-A [place]/Clinical Social Work Department Cell: [phone]    Final next level of care: [place] Barriers to Discharge: No Barriers Identified   Patient Goals and CMS Choice Patient states their goals for this hospitalization and ongoing recovery are:: Pt will report to [place] for rehab CMS Medicare.gov Compare Post Acute Care list provided to:: Patient Represenative (must comment) Choice offered to / list presented to : Adult Children  Discharge Placement   Existing PASRR number confirmed : 02/14/19          Patient chooses bed at: [facility] Patient to be transferred to facility by: [place] Name of family member notified: Delight Stare, Son Patient and family notified of of transfer: 02/21/19  Discharge Plan and Services     Post Acute Care Choice: [place]          DME Arranged: N/A DME Agency: NA       HH Arranged: NA HH Agency: NA        Social Determinants of Health (SDOH) Interventions     Readmission Risk Interventions No flowsheet data found.

## 2019-02-21 NOTE — Progress Notes (Addendum)
I called Bryan Paul, pt's son about the planned discharge and that pt is going to [place]. Questions answered. Pt is discharged to SNF via [facility]. Report was given to Claris Pong ar [facility]. Discharge packet was given to [facility].

## 2019-02-21 NOTE — Progress Notes (Signed)
Please see my updated discharge summary 7/17 thank you

## 2019-02-25 DIAGNOSIS — Z9181 History of falling: Secondary | ICD-10-CM | POA: Diagnosis not present

## 2019-02-25 DIAGNOSIS — S72142E Displaced intertrochanteric fracture of left femur, subsequent encounter for open fracture type I or II with routine healing: Secondary | ICD-10-CM | POA: Diagnosis not present

## 2019-02-25 DIAGNOSIS — E0590 Thyrotoxicosis, unspecified without thyrotoxic crisis or storm: Secondary | ICD-10-CM | POA: Diagnosis not present

## 2019-02-25 DIAGNOSIS — I10 Essential (primary) hypertension: Secondary | ICD-10-CM | POA: Diagnosis not present

## 2019-03-04 ENCOUNTER — Other Ambulatory Visit: Payer: Self-pay

## 2019-03-04 ENCOUNTER — Encounter: Payer: Self-pay | Admitting: Physician Assistant

## 2019-03-04 ENCOUNTER — Ambulatory Visit (INDEPENDENT_AMBULATORY_CARE_PROVIDER_SITE_OTHER): Payer: PPO | Admitting: Physician Assistant

## 2019-03-04 ENCOUNTER — Ambulatory Visit (INDEPENDENT_AMBULATORY_CARE_PROVIDER_SITE_OTHER): Payer: PPO

## 2019-03-04 DIAGNOSIS — S72142A Displaced intertrochanteric fracture of left femur, initial encounter for closed fracture: Secondary | ICD-10-CM

## 2019-03-04 NOTE — Progress Notes (Signed)
Post-Op Visit Note   Patient: Bryan Paul           Date of Birth: 10/19/1934           MRN: [id number] Visit Date: 03/04/2019 PCP: Ronita Hipps, MD   Assessment & Plan:  Chief Complaint:  Chief Complaint  Patient presents with  . Left Leg - Routine Post Op   Visit Diagnoses:  1. Closed comminuted intertrochanteric fracture of proximal end of left femur, initial encounter [facility])     Plan: Patient is a pleasant 84 year old demented gentleman who presents our clinic today 19 days status post left hip intramedullary nail from a left subtrochanteric femur fracture, date of surgery 02/13/2019.  He has been residing at a SNF.  He comes in today in a wheelchair.  The aide who is with him is unable to tell me whether he was ambulating prior to the fall.  He is not complaining of pain.  He has been working with physical therapy.  Examination of left hip reveals well-healed surgical incisions with staples in place.  No evidence of infection or cellulitis.  Calf is soft and nontender.  X-rays demonstrate stable alignment of the fractures without interval change.  At this point, staples were removed.  He will continue weightbearing as tolerated and he will advance with physical therapy.  He will follow-up with [place] in 4 weeks time for repeat evaluation and x-rays to include 4 views of the left femur.  Call with concerns or questions in the meantime.  Follow-Up Instructions: Return in about 4 weeks (around 04/01/2019).   Orders:  Orders Placed This Encounter  Procedures  . XR FEMUR MIN 2 VIEWS LEFT   No orders of the defined types were placed in this encounter.   Imaging: Xr Femur Min 2 Views Left  Result Date: 03/04/2019 X-rays demonstrate stable alignment of the fracture without interval change   PMFS History: Patient Active Problem List   Diagnosis Date Noted  . Protein-calorie malnutrition, severe 02/13/2019  . Closed comminuted intertrochanteric fracture of proximal end of  left femur, initial encounter ([place]) 02/12/2019  . Essential hypertension 02/12/2019  . Fall at home, initial encounter 02/12/2019  . Hip fracture ([place]) 02/12/2019  . Hyperthyroidism 02/03/2019  . On amiodarone therapy 01/03/2019  . Dizzy 01/03/2019  . CHF (congestive heart failure) ([place]) 01/07/2018  . Chronic anticoagulation 01/07/2018  . Fatigue 05/17/2017  . Atrial flutter ([place]) 03/06/2017  . Dilated cardiomyopathy ([place]) 03/06/2017  . Weight loss 06/13/2011  . Anxiety 06/13/2011  . HYPERTROPHY PROSTATE W/UR OBST & OTH LUTS 04/28/2009  . CONDUCTIVE HEARING LOSS BILATERAL 06/16/2008  . ING HERN W/OBST W/O MENTION GANGREN UNILAT/UNS 10/31/2007  . Hypertensive heart disease with heart failure ([place]) 03/12/2007   Past Medical History:  Diagnosis Date  . Adhesive capsulitis of shoulder 12/20/2009   Qualifier: Diagnosis of  By: Arnoldo Morale MD, Balinda [place]   . Anxiety 06/13/2011  . Atrial flutter ([place]) 03/06/2017  . CHF (congestive heart failure) ([place]) 01/07/2018  . CONDUCTIVE HEARING LOSS BILATERAL 06/16/2008   Qualifier: Diagnosis of  By: Arnoldo Morale MD, John E   . Dilated cardiomyopathy ([place]) 03/06/2017  . Fatigue 05/17/2017  . Hypertension   . HYPERTENSION 03/12/2007   Qualifier: Diagnosis of  By: Jimmye Norman, LPN, [place] Cun[place]   . HYPERTROPHY PROSTATE W/UR OBST & OTH LUTS 04/28/2009   Qualifier: Diagnosis of  By: Arnoldo Morale MD, [place] W/OBST W/O MENTION GANGREN UNILAT/UNS 10/31/2007   Qualifier: Diagnosis  of  By: Lovell Sheehan MD, Balinda Quails Weight loss 06/13/2011    Family History  Problem Relation Age of Onset  . Aneurysm Other   . Stroke Father   . Anxiety disorder Sister   . CAD Neg Hx   . Sudden Cardiac Death Neg Hx     Past Surgical History:  Procedure Laterality Date  . colonscopy  09/07/2006  . INTRAMEDULLARY (IM) NAIL INTERTROCHANTERIC Left 02/13/2019   Procedure: INTRAMEDULLARY (IM) NAIL INTERTROCHANTRIC, Left Femur;  Surgeon: Tarry Kos, MD;  Location: [facility] OR;  Service: Orthopedics;   Laterality: Left;   Social History   Occupational History  . Not on file  Tobacco Use  . Smoking status: Former Smoker    Types: Cigarettes  . Smokeless tobacco: Never Used  Substance and Sexual Activity  . Alcohol use: No  . Drug use: No  . Sexual activity: Not on file

## 2019-03-06 DIAGNOSIS — R2681 Unsteadiness on feet: Secondary | ICD-10-CM | POA: Diagnosis not present

## 2019-03-06 DIAGNOSIS — M6281 Muscle weakness (generalized): Secondary | ICD-10-CM | POA: Diagnosis not present

## 2019-03-06 DIAGNOSIS — R1312 Dysphagia, oropharyngeal phase: Secondary | ICD-10-CM | POA: Diagnosis not present

## 2019-03-06 DIAGNOSIS — S72142E Displaced intertrochanteric fracture of left femur, subsequent encounter for open fracture type I or II with routine healing: Secondary | ICD-10-CM | POA: Diagnosis not present

## 2019-03-07 DIAGNOSIS — I10 Essential (primary) hypertension: Secondary | ICD-10-CM | POA: Diagnosis not present

## 2019-03-07 DIAGNOSIS — I4891 Unspecified atrial fibrillation: Secondary | ICD-10-CM | POA: Diagnosis not present

## 2019-03-07 DIAGNOSIS — N400 Enlarged prostate without lower urinary tract symptoms: Secondary | ICD-10-CM | POA: Diagnosis not present

## 2019-03-19 DIAGNOSIS — Z743 Need for continuous supervision: Secondary | ICD-10-CM | POA: Diagnosis not present

## 2019-03-19 DIAGNOSIS — R531 Weakness: Secondary | ICD-10-CM | POA: Diagnosis not present

## 2019-03-25 ENCOUNTER — Ambulatory Visit: Payer: PPO | Admitting: Internal Medicine

## 2019-03-26 ENCOUNTER — Ambulatory Visit: Payer: PPO | Admitting: Internal Medicine

## 2019-04-01 ENCOUNTER — Ambulatory Visit: Payer: PPO | Admitting: Orthopaedic Surgery

## 2019-04-07 DIAGNOSIS — I4891 Unspecified atrial fibrillation: Secondary | ICD-10-CM | POA: Diagnosis not present

## 2019-04-07 DIAGNOSIS — I10 Essential (primary) hypertension: Secondary | ICD-10-CM | POA: Diagnosis not present

## 2019-05-07 ENCOUNTER — Ambulatory Visit: Payer: PPO | Admitting: Cardiology

## 2019-05-07 DIAGNOSIS — I10 Essential (primary) hypertension: Secondary | ICD-10-CM | POA: Diagnosis not present

## 2019-05-07 DIAGNOSIS — I482 Chronic atrial fibrillation, unspecified: Secondary | ICD-10-CM | POA: Diagnosis not present

## 2019-06-09 ENCOUNTER — Other Ambulatory Visit: Payer: Self-pay | Admitting: Internal Medicine

## 2019-11-03 IMAGING — DX PORTABLE PELVIS 1-2 VIEWS
1 series · 2 of 2 positions shown · non-contrast
Comparison: Intraoperative films from earlier in the same day.

CLINICAL DATA: Status post ORIF of proximal left femoral fracture

EXAM:
PORTABLE PELVIS 1-2 VIEWS

[Series 1: pelvis · 0.14mm/px · 2 of 2 slices shown]
[im 1/2]
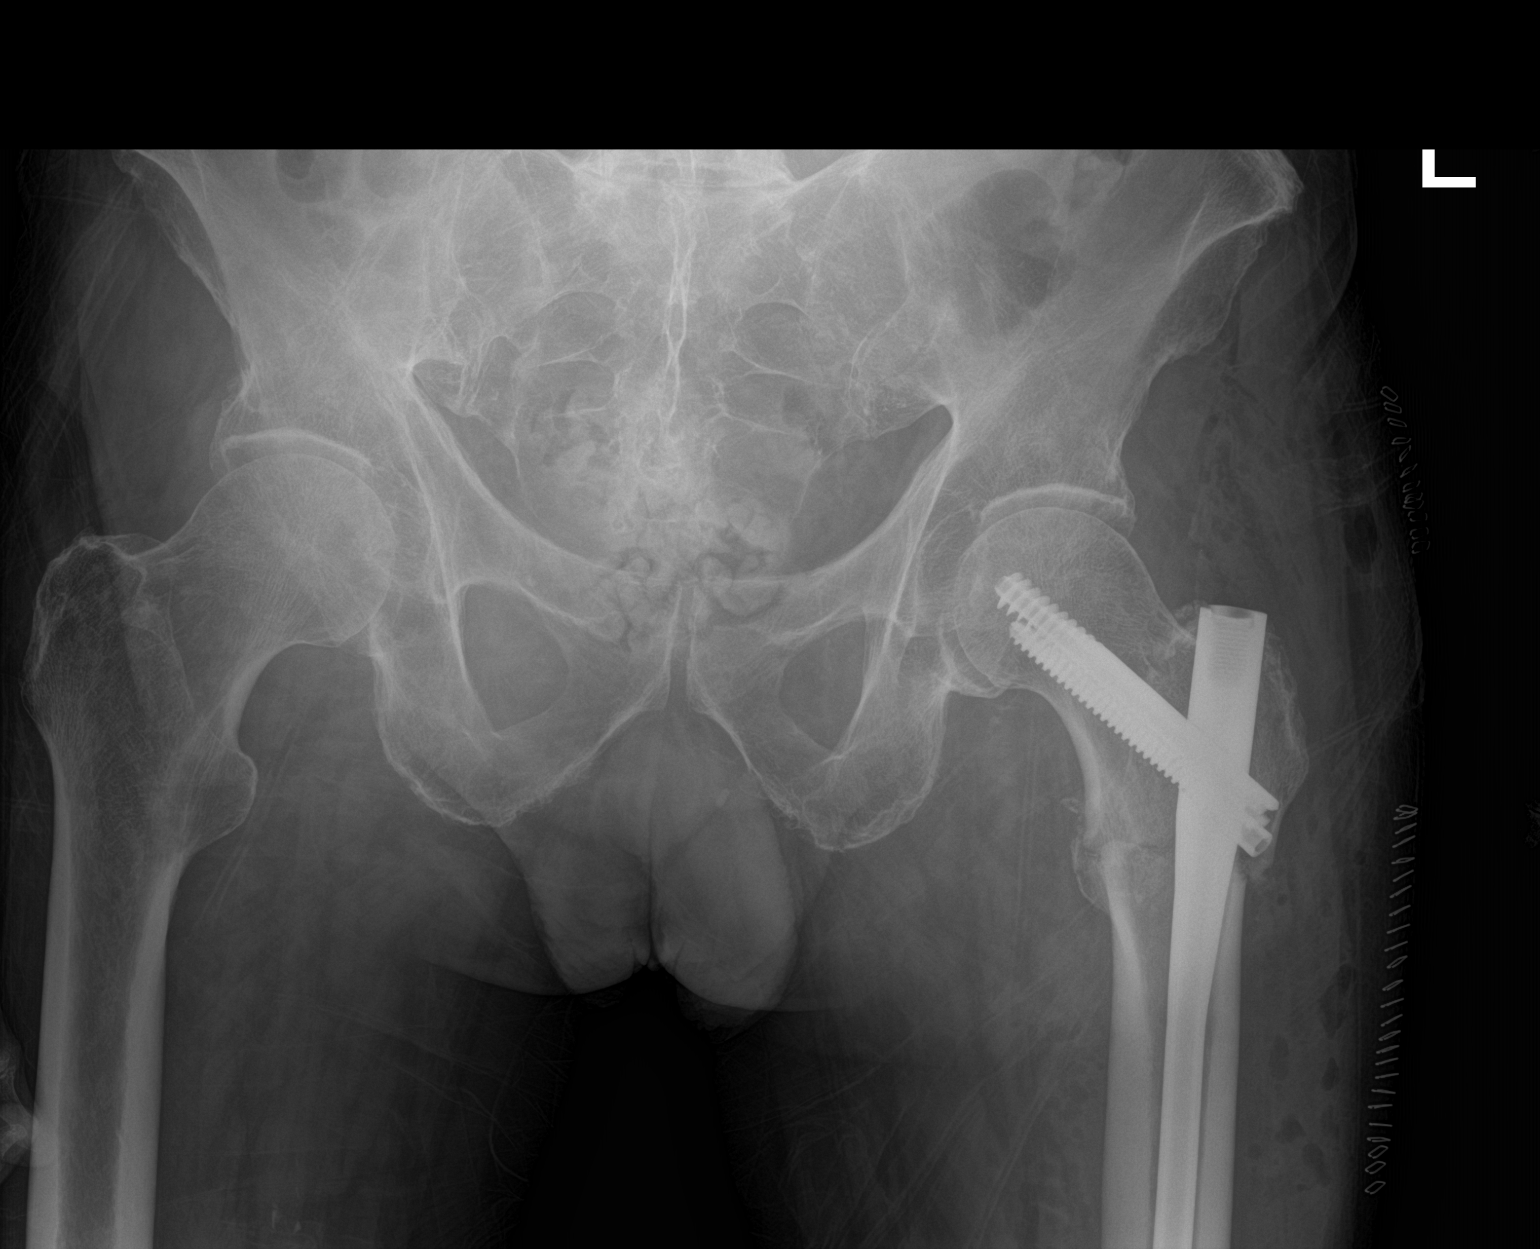
[im 2/2]
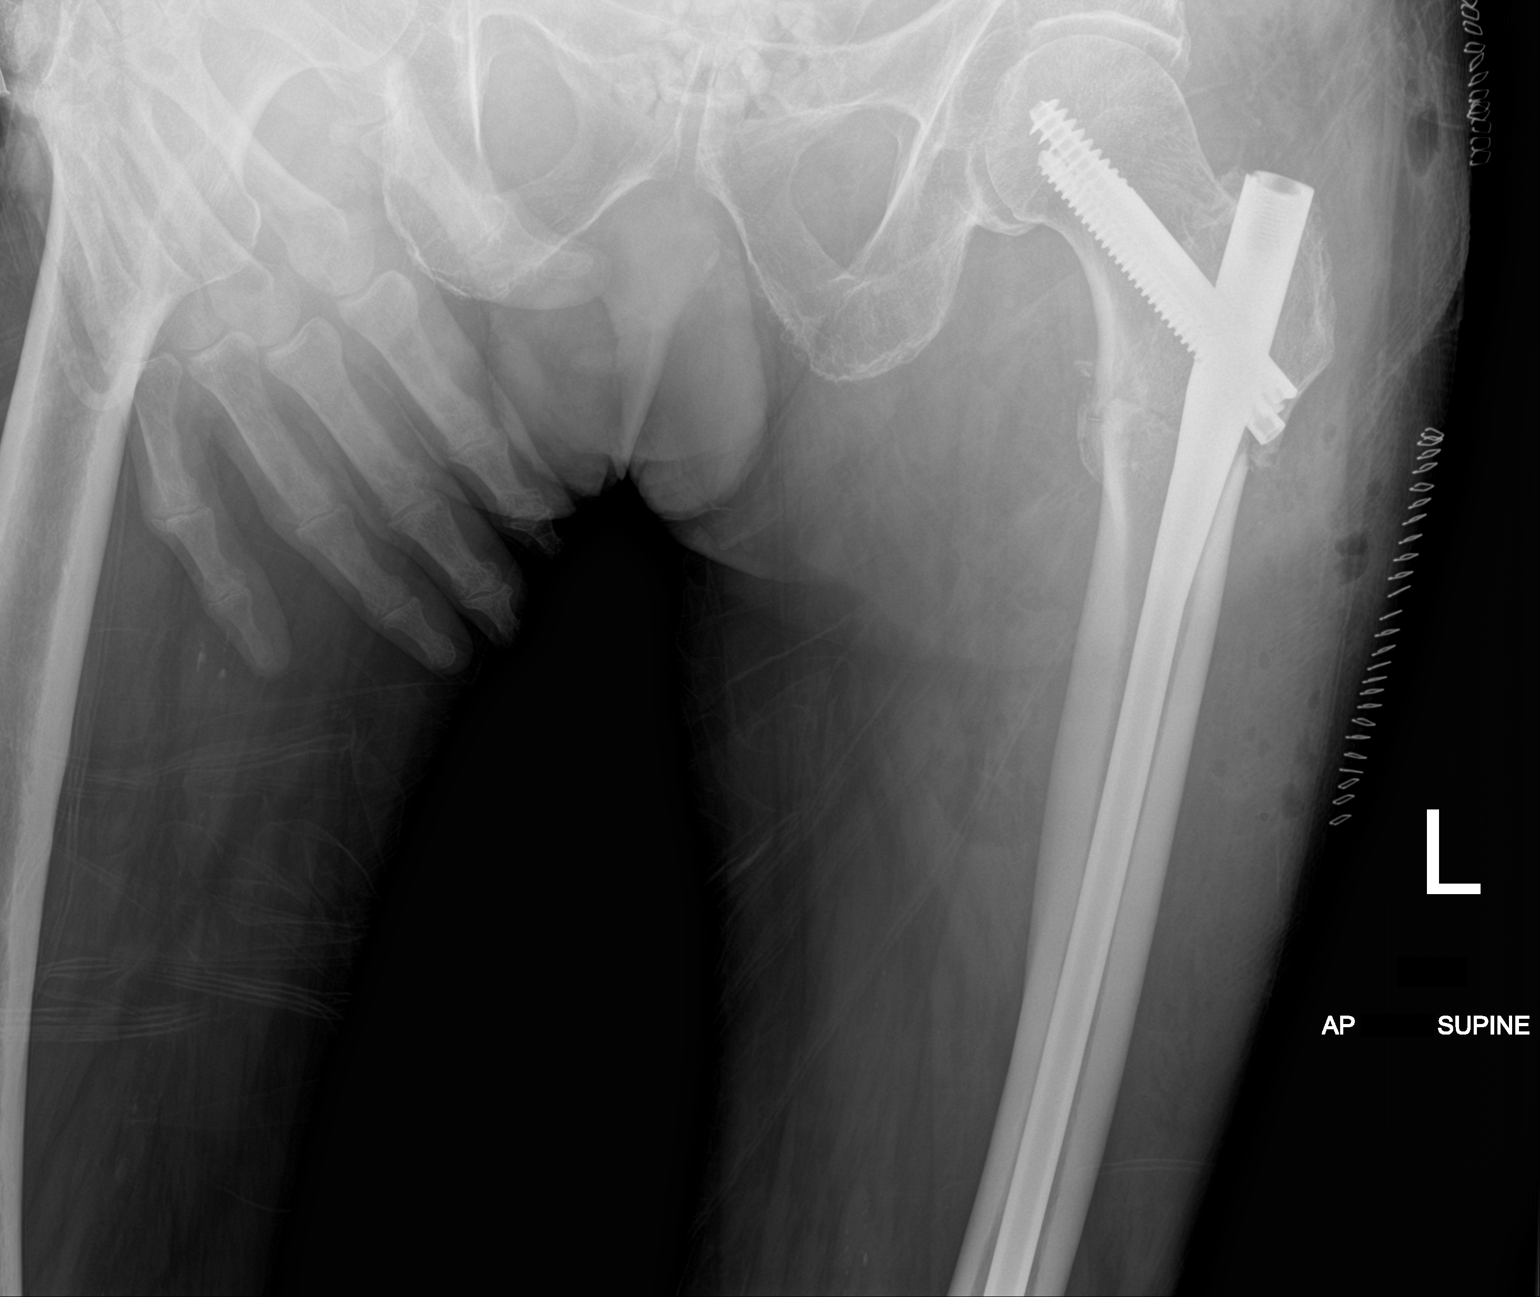

[2 of 2 positions shown; findings below may reference images not displayed]

FINDINGS: Medullary rod is noted within the proximal left femur. Two fixation
screws are noted traversing the femoral neck. Fracture fragments are
in near anatomic alignment.
IMPRESSION: ORIF of proximal left femoral fracture.

## 2019-11-03 IMAGING — RF DG C-ARM 61-120 MIN
1 series · 7 of 7 positions shown · non-contrast
Comparison: 02/12/2019.

CLINICAL DATA: ORIF.

EXAM:
LEFT FEMUR 2 VIEWS; DG C-ARM 61-120 MIN

[Series 1: run · 7 of 7 slices shown]
[im 1/7]
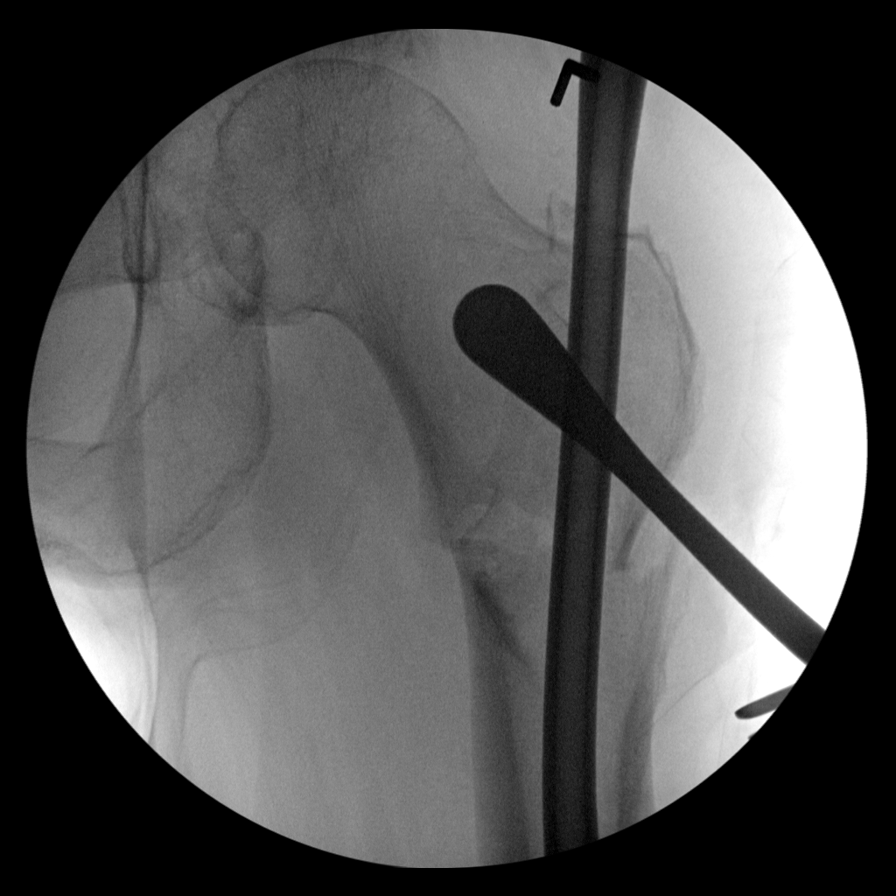
[im 2/7]
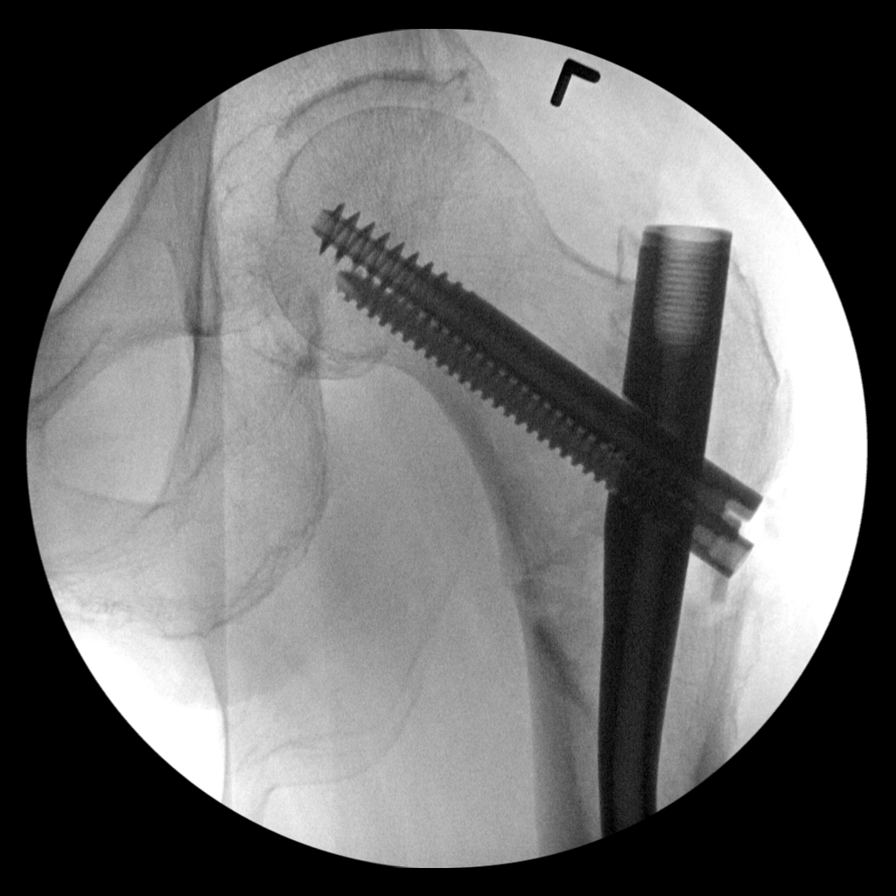
[im 3/7]
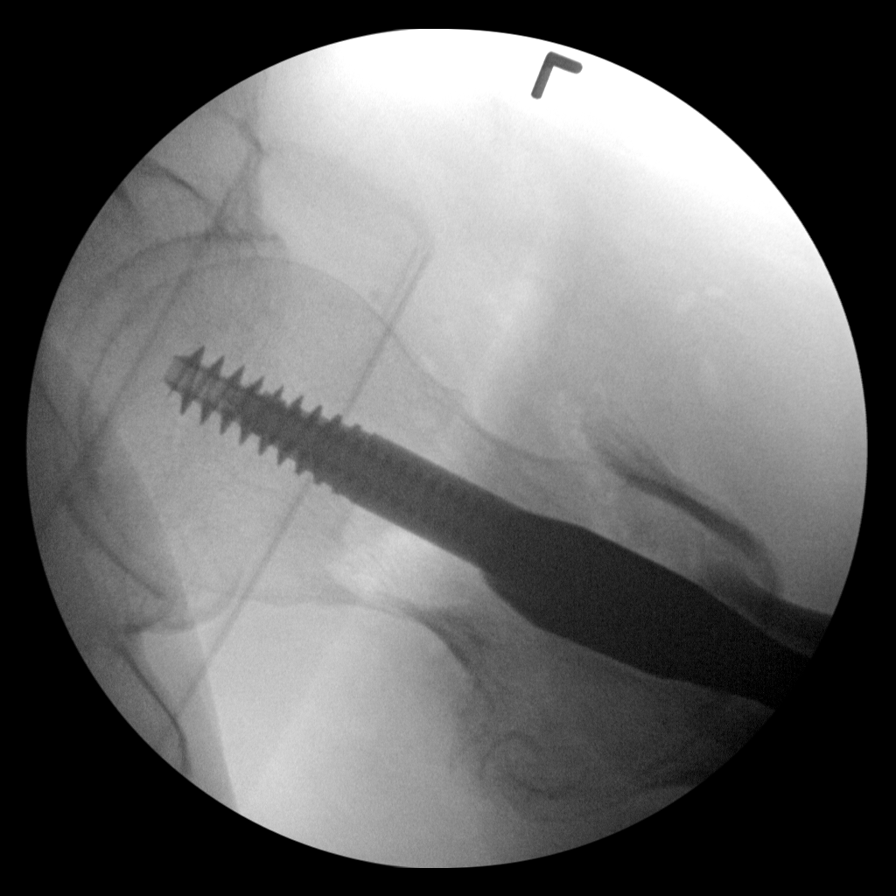
[im 4/7]
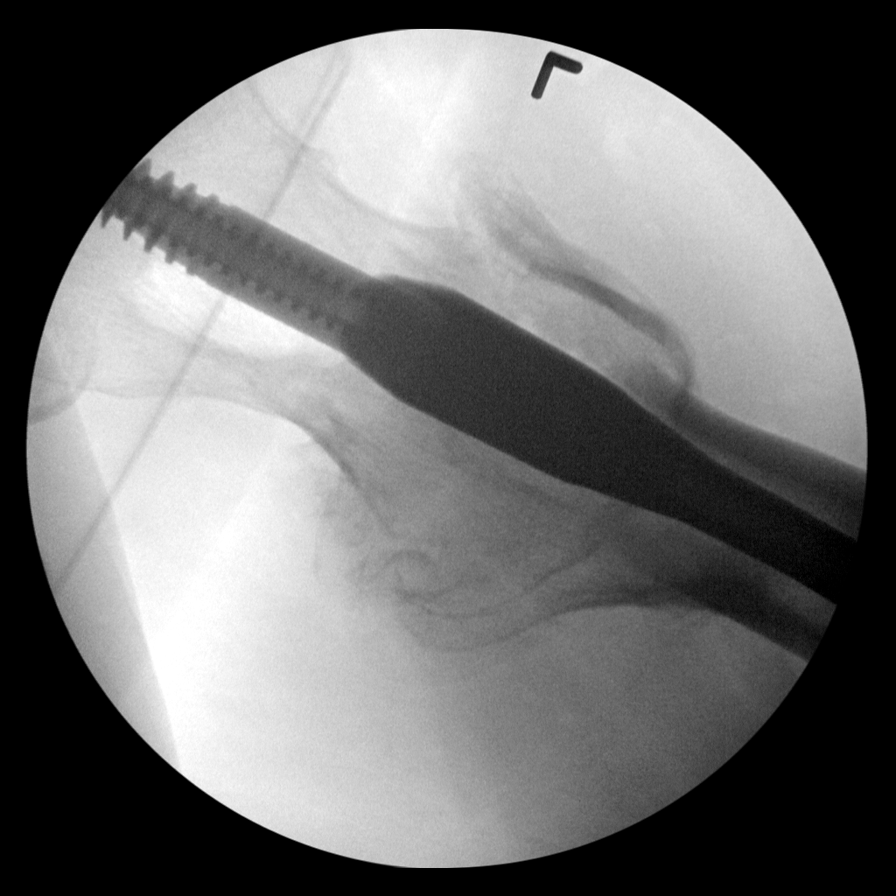
[im 5/7]
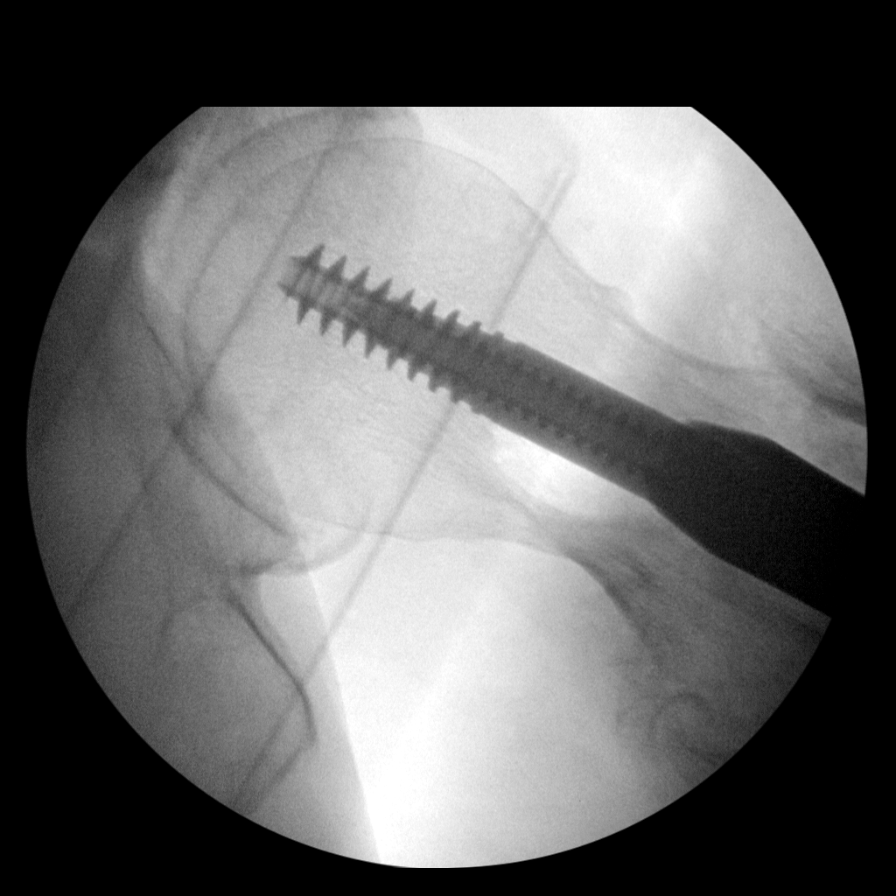
[im 6/7]
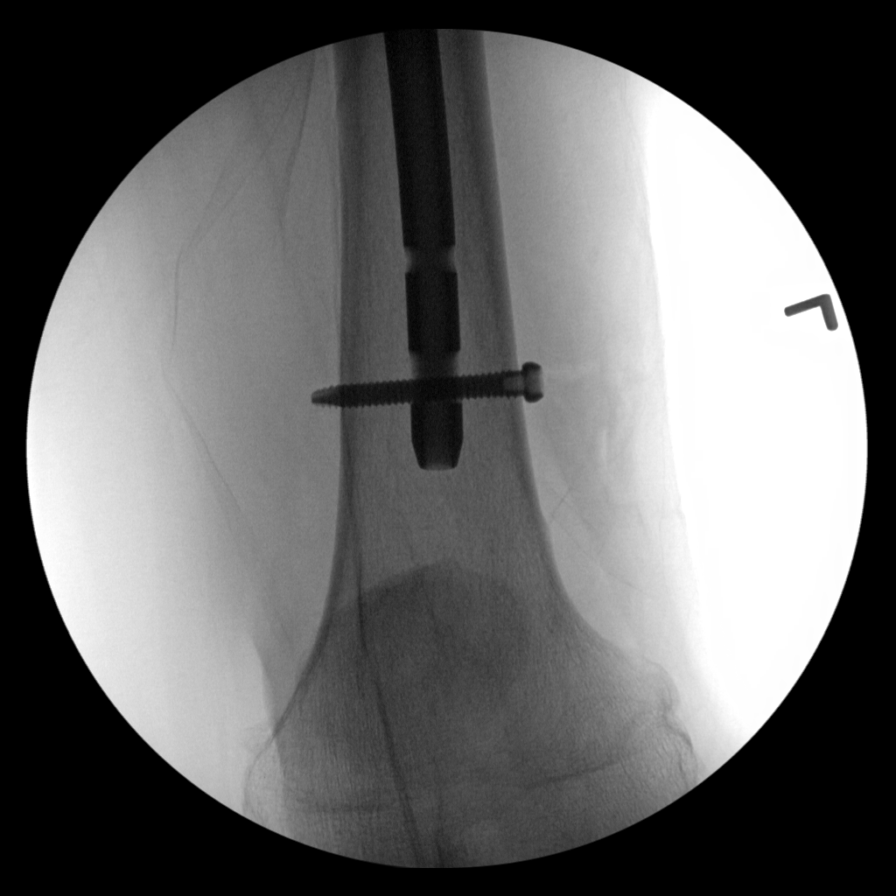
[im 7/7]
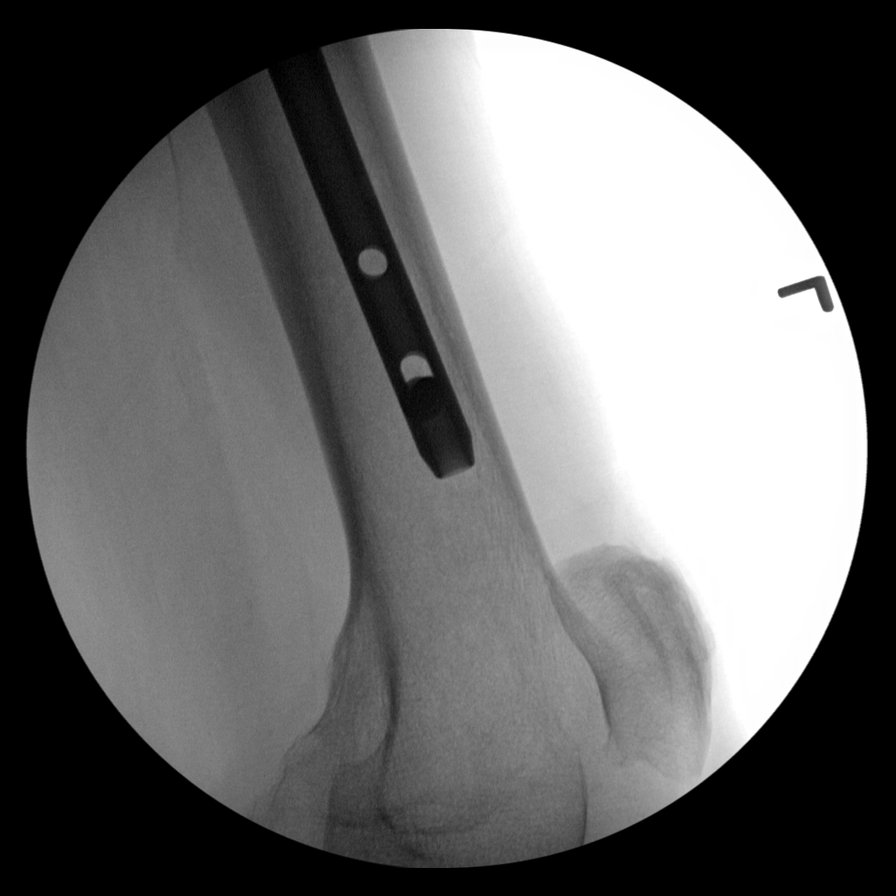

[7 of 7 positions shown; findings below may reference images not displayed]

FINDINGS: ORIF left femur. Hardware intact. Anatomic alignment. 2 minutes 27
seconds fluoroscopy time utilized.
IMPRESSION: ORIF left femur.  Anatomic alignment.

## 2020-01-19 DIAGNOSIS — R69 Illness, unspecified: Secondary | ICD-10-CM | POA: Diagnosis not present

## 2020-01-19 DIAGNOSIS — R5381 Other malaise: Secondary | ICD-10-CM | POA: Diagnosis not present

## 2020-01-24 DIAGNOSIS — F329 Major depressive disorder, single episode, unspecified: Secondary | ICD-10-CM | POA: Diagnosis not present

## 2020-01-24 DIAGNOSIS — E0590 Thyrotoxicosis, unspecified without thyrotoxic crisis or storm: Secondary | ICD-10-CM | POA: Diagnosis not present

## 2020-01-24 DIAGNOSIS — E860 Dehydration: Secondary | ICD-10-CM | POA: Diagnosis not present

## 2020-01-24 DIAGNOSIS — I4891 Unspecified atrial fibrillation: Secondary | ICD-10-CM | POA: Diagnosis not present

## 2020-01-24 DIAGNOSIS — F0390 Unspecified dementia without behavioral disturbance: Secondary | ICD-10-CM | POA: Diagnosis not present

## 2020-01-24 DIAGNOSIS — Z79899 Other long term (current) drug therapy: Secondary | ICD-10-CM | POA: Diagnosis not present

## 2020-01-24 DIAGNOSIS — R569 Unspecified convulsions: Secondary | ICD-10-CM | POA: Diagnosis not present

## 2020-01-24 DIAGNOSIS — R001 Bradycardia, unspecified: Secondary | ICD-10-CM | POA: Diagnosis not present

## 2020-01-24 DIAGNOSIS — Z7902 Long term (current) use of antithrombotics/antiplatelets: Secondary | ICD-10-CM | POA: Diagnosis not present

## 2020-01-24 DIAGNOSIS — N3000 Acute cystitis without hematuria: Secondary | ICD-10-CM | POA: Diagnosis not present

## 2020-01-24 DIAGNOSIS — R404 Transient alteration of awareness: Secondary | ICD-10-CM | POA: Diagnosis not present

## 2020-01-24 DIAGNOSIS — I10 Essential (primary) hypertension: Secondary | ICD-10-CM | POA: Diagnosis not present

## 2020-01-25 DIAGNOSIS — N390 Urinary tract infection, site not specified: Secondary | ICD-10-CM | POA: Diagnosis not present

## 2020-01-25 DIAGNOSIS — E860 Dehydration: Secondary | ICD-10-CM | POA: Diagnosis not present

## 2020-01-25 DIAGNOSIS — R569 Unspecified convulsions: Secondary | ICD-10-CM | POA: Diagnosis not present

## 2020-01-26 DIAGNOSIS — R569 Unspecified convulsions: Secondary | ICD-10-CM | POA: Diagnosis not present

## 2020-01-26 DIAGNOSIS — N390 Urinary tract infection, site not specified: Secondary | ICD-10-CM | POA: Diagnosis not present

## 2020-01-26 DIAGNOSIS — E860 Dehydration: Secondary | ICD-10-CM | POA: Diagnosis not present

## 2020-03-05 DIAGNOSIS — N390 Urinary tract infection, site not specified: Secondary | ICD-10-CM | POA: Diagnosis not present

## 2020-08-07 DEATH — deceased
